# Patient Record
Sex: Male | Born: 1970
Health system: Southern US, Community
[De-identification: ages and names within clinical notes are randomized; demographics above are authoritative.]

## PROBLEM LIST (undated history)

## (undated) ENCOUNTER — Ambulatory Visit: Source: Home / Self Care

## (undated) ENCOUNTER — Ambulatory Visit: Admission: EM | Source: Home / Self Care

## (undated) DIAGNOSIS — M1990 Unspecified osteoarthritis, unspecified site: Secondary | ICD-10-CM

## (undated) DIAGNOSIS — M109 Gout, unspecified: Secondary | ICD-10-CM

## (undated) DIAGNOSIS — E119 Type 2 diabetes mellitus without complications: Secondary | ICD-10-CM

## (undated) HISTORY — DX: Type 2 diabetes mellitus without complications: E11.9

## (undated) HISTORY — DX: Unspecified osteoarthritis, unspecified site: M19.90

---

## 2000-01-19 ENCOUNTER — Emergency Department (HOSPITAL_COMMUNITY): Admission: EM | Admit: 2000-01-19 | Discharge: 2000-01-19 | Payer: Self-pay | Admitting: Emergency Medicine

## 2004-11-19 ENCOUNTER — Ambulatory Visit (HOSPITAL_COMMUNITY): Admission: RE | Admit: 2004-11-19 | Discharge: 2004-11-19 | Payer: Self-pay | Admitting: Orthopedic Surgery

## 2007-10-10 ENCOUNTER — Emergency Department (HOSPITAL_COMMUNITY): Admission: EM | Admit: 2007-10-10 | Discharge: 2007-10-11 | Payer: Self-pay | Admitting: Emergency Medicine

## 2010-12-28 ENCOUNTER — Encounter: Payer: Self-pay | Admitting: Orthopedic Surgery

## 2011-09-16 LAB — RAPID URINE DRUG SCREEN, HOSP PERFORMED
Amphetamines: NOT DETECTED
Barbiturates: NOT DETECTED
Benzodiazepines: NOT DETECTED
Cocaine: POSITIVE — AB
Opiates: NOT DETECTED
Tetrahydrocannabinol: NOT DETECTED

## 2011-09-16 LAB — POCT I-STAT CREATININE
Creatinine, Ser: 1.5
Operator id: 272551

## 2011-09-16 LAB — CBC
HCT: 50.2
Hemoglobin: 17.1 — ABNORMAL HIGH
MCHC: 34
MCV: 90.7
Platelets: 323
RBC: 5.53
RDW: 12.9
WBC: 10.8 — ABNORMAL HIGH

## 2011-09-16 LAB — I-STAT 8, (EC8 V) (CONVERTED LAB)
BUN: 15
Bicarbonate: 24.2 — ABNORMAL HIGH
Chloride: 109
Glucose, Bld: 109 — ABNORMAL HIGH
HCT: 52
Hemoglobin: 17.7 — ABNORMAL HIGH
Operator id: 272551
Potassium: 3.9
Sodium: 138
TCO2: 25
pCO2, Ven: 39 — ABNORMAL LOW
pH, Ven: 7.401 — ABNORMAL HIGH

## 2011-09-16 LAB — ETHANOL: Alcohol, Ethyl (B): 5

## 2013-03-21 ENCOUNTER — Ambulatory Visit (INDEPENDENT_AMBULATORY_CARE_PROVIDER_SITE_OTHER): Payer: BC Managed Care – PPO | Admitting: Family Medicine

## 2013-03-21 VITALS — BP 114/82 | HR 80 | Temp 98.8°F | Resp 18 | Wt 219.0 lb

## 2013-03-21 DIAGNOSIS — L03317 Cellulitis of buttock: Secondary | ICD-10-CM

## 2013-03-21 DIAGNOSIS — D2320 Other benign neoplasm of skin of unspecified ear and external auricular canal: Secondary | ICD-10-CM

## 2013-03-21 DIAGNOSIS — L0231 Cutaneous abscess of buttock: Secondary | ICD-10-CM

## 2013-03-21 DIAGNOSIS — D2322 Other benign neoplasm of skin of left ear and external auricular canal: Secondary | ICD-10-CM

## 2013-03-21 DIAGNOSIS — M25569 Pain in unspecified knee: Secondary | ICD-10-CM

## 2013-03-21 DIAGNOSIS — M25562 Pain in left knee: Secondary | ICD-10-CM

## 2013-03-21 MED ORDER — MELOXICAM 7.5 MG PO TABS: ORAL_TABLET | ORAL | Status: DC

## 2013-03-21 MED ORDER — DOXYCYCLINE HYCLATE 100 MG PO TABS: 100.0000 mg | ORAL_TABLET | Freq: Two times a day (BID) | ORAL | Status: DC

## 2013-03-21 NOTE — Progress Notes (Signed)
Procedure Note: Verbal consent obtained from the patient.  Local anesthesia with 0.2cc 1% plain lidocaine.  Sterile prep.  Lesion shaved and sent for pathology.  Hemostasis obtained with direct pressure.  Dressing applied.  Pt tolerated well.

## 2013-03-21 NOTE — Progress Notes (Addendum)
[facility] [address], [place] [address] [phone]  Date:  03/21/2013   Name:  Chase Higgins   DOB:  09-25-1971   MRN:  [id number]  PCP:  No primary provider on file.    Chief Complaint: Knee Pain and infection of left ear   History of Present Illness:  Chase Higgins is a 42 y.o. 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  He stepped on the handle of a shovel hat was lying on the ground and his left leg jerked. He did not have immediate pain, but this am he noted pain in the medial/ posterior left knee joint line.  The pain in his knee has gotten better today, he did not take any medication as of yet.    No history of knee surgery, but he would like to think about this at some point.  His records are all at [facility] ortho so he plans to talk with them about this.    He also notes a small ?growth on the certiledge of his left ear.  It has gotten larger and it hurts to lie on that side.  He is not aware of any injury to the area.    He also mentioned a "staph infection" of his left buttock near the anal area that has been present for 3 or 4 days and is becoming more painful.  It is now difficult to sit on the area.    He is otherwise generally healthy.    There is no problem list on file for this patient.   Past Medical History  Diagnosis Date  . Arthritis     History reviewed. No pertinent past surgical history.  History  Substance Use Topics  . Smoking status: Current Every Day Smoker  . Smokeless tobacco: Not on file  . Alcohol Use: Yes    Family History  Problem Relation Age of Onset  . Hypertension Mother   . Diabetes Father     No Known Allergies  Medication list has been reviewed and updated.  No current  outpatient prescriptions on file prior to visit.   No current facility-administered medications on file prior to visit.    Review of Systems:  As per HPI- otherwise negative.   Physical Examination: Filed Vitals:   03/21/13 1228  BP: 114/82  Pulse: 80  Temp: 98.8 F (37.1 C)  Resp: 18   Filed Vitals:   03/21/13 1228  Weight: 219 lb (99.338 kg)   There is no height on file to calculate BMI. Ideal Body Weight:    GEN: WDWN, NAD, Non-toxic, A & O x 3 HEENT: Atraumatic, Normocephalic. Neck supple. No masses, No LAD. Ears and Nose: No external deformity. CV: RRR, No M/G/R. No JVD. No thrill. No extra heart sounds. PULM: CTA B, no wheezes, crackles, rhonchi. No retractions. No resp. distress. No accessory muscle use. ABD: S, NT, ND, +BS. No rebound. No HSM. EXTR: No c/c/e NEURO Normal gait.  PSYCH: Normally interactive. Conversant. Not depressed or anxious appearing.  Calm demeanor.  left knee:   Tender at the medial posterior knee- seems to be at the hamstrings insertion.  He has minimal laxity to anterior drawer testing.  There is no joint effusion, and  the knee joint does display any heat/ redness or tenderness.  He has normal quad and hamstring strength, normal patellar DTR bilaterally left ear: there is a cutaneous lesion at the helix, which appears consistent with a wart or cutanous horn.   Left buttock: there is a tender and fluctuant area in the right buttock, about 1 inch lateral to the anus.    Procedure note: buttock abscess.   VC obtained: prepped with betadine and alcohol, anesthesia to buttock with 2ml of 1% lidocaine with epi.  Performed I and D with 11 blade and expressed a large amount of pus.  Placed about 3inches of 1/2 packing and dressed.    See procedure note per Frances Furbish PA- C regarding procedure to left ear.    Assessment and Plan: Knee pain, acute, left - Plan: meloxicam (MOBIC) 7.5 MG tablet  Benign skin growth of ear, left - Plan: Dermatology  pathology  Cellulitis and abscess of buttock - Plan: Wound culture, doxycycline (VIBRA-TABS) 100 MG tablet, meloxicam (MOBIC) 7.5 MG tablet  Treat knee pain with meloxicam, await wound culture and dermatology path report.  Treat with doxycycline, and recheck for wound care in 1 to 2 days  He plans to follow- up with ortho for his knee issues  Signed Abbe Amsterdam, MD  03/23/13- received wound culture result- negative at 2 days, still growing.   4/22:  Called and LMOM.  Wound culture grew just mixed bacteria. As long as he is better nothing further needed there.  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  However, there were also possible changes of actinic keratosis, which is precancerous. recommended a recheck in clinic in one month.  Will also send a letter

## 2013-03-21 NOTE — Patient Instructions (Addendum)
Use the antibiotic as directed and the pain medicine as needed. Keep both of your areas clean and dry.  Please come by clinic tomorrow or early Wednesday to have the abscess rechecked.  The pain medicine should also help with your knee pain.    Please show this paper as a FAST PASS for WOUND CARE

## 2013-03-24 ENCOUNTER — Telehealth: Payer: Self-pay | Admitting: Radiology

## 2013-03-24 NOTE — Telephone Encounter (Signed)
Spoke to patient he states area of abscess is much better, advised him preliminary report (gram stain ) was negative, he states he took packing out when he got home and is doing fine, he states a lot of pus drained out of the area, now it has closed. He states he will come back in if it bothers him again, but he is too busy to come in now. He is advised we will call when wound culture final. He also advised he has not seen ortho yet for knee, he wants to put this off as long as possible. To you FYI , I think you tried to call him.

## 2013-03-25 LAB — WOUND CULTURE: Gram Stain: NONE SEEN

## 2013-03-29 ENCOUNTER — Encounter: Payer: Self-pay | Admitting: Family Medicine

## 2013-06-22 ENCOUNTER — Emergency Department (HOSPITAL_COMMUNITY)
Admission: EM | Admit: 2013-06-22 | Discharge: 2013-06-22 | Disposition: A | Discharge status: Home / Self Care | Payer: BC Managed Care – PPO | Attending: Emergency Medicine | Admitting: Emergency Medicine

## 2013-06-22 ENCOUNTER — Encounter (HOSPITAL_COMMUNITY): Payer: Self-pay | Admitting: Emergency Medicine

## 2013-06-22 ENCOUNTER — Emergency Department (HOSPITAL_COMMUNITY): Payer: BC Managed Care – PPO

## 2013-06-22 DIAGNOSIS — M129 Arthropathy, unspecified: Secondary | ICD-10-CM | POA: Insufficient documentation

## 2013-06-22 DIAGNOSIS — Y9389 Activity, other specified: Secondary | ICD-10-CM | POA: Insufficient documentation

## 2013-06-22 DIAGNOSIS — S93602A Unspecified sprain of left foot, initial encounter: Secondary | ICD-10-CM

## 2013-06-22 DIAGNOSIS — X500XXA Overexertion from strenuous movement or load, initial encounter: Secondary | ICD-10-CM | POA: Insufficient documentation

## 2013-06-22 DIAGNOSIS — Y9229 Other specified public building as the place of occurrence of the external cause: Secondary | ICD-10-CM | POA: Insufficient documentation

## 2013-06-22 DIAGNOSIS — S93609A Unspecified sprain of unspecified foot, initial encounter: Secondary | ICD-10-CM | POA: Insufficient documentation

## 2013-06-22 DIAGNOSIS — F17200 Nicotine dependence, unspecified, uncomplicated: Secondary | ICD-10-CM | POA: Insufficient documentation

## 2013-06-22 MED ORDER — HYDROCODONE-ACETAMINOPHEN 5-325 MG PO TABS: ORAL_TABLET | ORAL | Status: DC

## 2013-06-22 MED ORDER — IBUPROFEN 800 MG PO TABS: 800.0000 mg | ORAL_TABLET | Freq: Three times a day (TID) | ORAL | Status: DC

## 2013-06-22 NOTE — ED Provider Notes (Signed)
History    CSN: [id number] Arrival date & time 06/22/13  5784  First MD Initiated Contact with Patient 06/22/13 0601     Chief Complaint  Patient presents with  . Foot Injury   (Consider location/radiation/quality/duration/timing/severity/associated sxs/prior Treatment) HPI Pt is a 42yo male presenting with gradually worsening left foot pain that started this morning after pt stepped in a hole at a [employer].  Pt states he was getting out of his truck when his foot fell through several wood planks where the water drains, does not recall if he twisted his ankle or not.  States pain gradually increased by the time he got home, constant, 7/10, aching and throbbing pain, worse with weight bearing. Has tried ibuprofen w/o relief. Denies ecchymosis or open wound to the skin. Denies previous injury of left foot.  Denies hitting head, back pain, or any other injuries  Past Medical History  Diagnosis Date  . Arthritis    History reviewed. No pertinent past surgical history. Family History  Problem Relation Age of Onset  . Hypertension Mother   . Diabetes Father    History  Substance Use Topics  . Smoking status: Current Every Day Smoker  . Smokeless tobacco: Not on file  . Alcohol Use: Yes    Review of Systems  Musculoskeletal: Positive for myalgias and arthralgias.  Skin: Negative for color change and wound.  Neurological: Negative for dizziness, light-headedness and headaches.  All other systems reviewed and are negative.    Allergies  Review of patient's allergies indicates no known allergies.  Home Medications   Current Outpatient Rx  Name  Route  Sig  Dispense  Refill  . ibuprofen (ADVIL,MOTRIN) 200 MG tablet   Oral   Take 600 mg by mouth every 6 (six) hours as needed for pain.         Marland Kitchen HYDROcodone-acetaminophen (NORCO/VICODIN) 5-325 MG per tablet      Take 1-2 pills every 4-6 hours as needed for pain.   6 tablet   0   . ibuprofen (ADVIL,MOTRIN) 800  MG tablet   Oral   Take 1 tablet (800 mg total) by mouth 3 (three) times daily.   21 tablet   0    BP 147/98  Pulse 107  Temp(Src) 99 F (37.2 C) (Oral)  Resp 14  SpO2 97% Physical Exam  Nursing note and vitals reviewed. Constitutional: He appears well-developed and well-nourished.  HENT:  Head: Normocephalic and atraumatic.  Eyes: Conjunctivae are normal. No scleral icterus.  Neck: Normal range of motion.  Cardiovascular: Normal rate, regular rhythm and normal heart sounds.   Pulmonary/Chest: Effort normal and breath sounds normal. No respiratory distress. He has no wheezes. He has no rales. He exhibits no tenderness.  Musculoskeletal: Normal range of motion. He exhibits tenderness (dorsum of left foot, along lateral aspect of foot near head of 5th metatarsal). He exhibits no edema.  No edema. FROM. TTP along dorsum or left foot, and along lateral aspect of left foot near head of 5th metatarsal.  Pain with weight bearing.   Neurological: He is alert.  Skin: Skin is warm and dry.  Skin in tact, no ecchymosis or erythema    Psychiatric: He has a normal mood and affect. His behavior is normal.    ED Course  Procedures (including critical care time) Labs Reviewed - No data to display Dg Foot Complete Left  06/22/2013   *RADIOLOGY REPORT*  Clinical Data: Fall, lateral pain  LEFT FOOT - COMPLETE  3+ VIEW  Comparison: None.  Findings: Normal alignment without fracture.  Preserved joint spaces.  No soft tissue abnormality.  IMPRESSION: No acute osseous finding   Original Report Authenticated By: Judie Petit. Shick, M.D.   1. Foot sprain, left, initial encounter     MDM  Pt has sprained foot, no fracture on imaging.  No open skin or edema.  FROM.  Able to weight bear with some pain.  Will  tx symptomatically with ace wrap.  Pt already has a crutch he used to come to ED.   Rx: norco, ibuprofen, ace wrap. Will discharge pt home and have him f/u with [facility] for continued  pain in 1-2 weeks, info provided. Return precautions given. Pt verbalized understanding and agreement with tx plan. Vitals: unremarkable. Discharged in stable condition.       Junius Finner, PA-C 06/22/13 [phone]

## 2013-06-22 NOTE — ED Notes (Signed)
PT. MISSED HIS STEP AND INJURED HIS LEFT FOOT THIS MORNING WITH PAIN /SWELLING .

## 2013-06-22 NOTE — ED Provider Notes (Signed)
Medical screening examination/treatment/procedure(s) were performed by non-physician practitioner and as supervising physician I was immediately available for consultation/collaboration.   Brandt Loosen, MD 06/22/13 [phone]

## 2013-08-09 ENCOUNTER — Ambulatory Visit: Payer: BC Managed Care – PPO

## 2013-08-09 ENCOUNTER — Ambulatory Visit (INDEPENDENT_AMBULATORY_CARE_PROVIDER_SITE_OTHER): Payer: BC Managed Care – PPO | Admitting: Emergency Medicine

## 2013-08-09 VITALS — BP 122/88 | HR 84 | Temp 98.0°F | Resp 20 | Ht 66.0 in | Wt 196.0 lb

## 2013-08-09 DIAGNOSIS — M549 Dorsalgia, unspecified: Secondary | ICD-10-CM

## 2013-08-09 DIAGNOSIS — J018 Other acute sinusitis: Secondary | ICD-10-CM

## 2013-08-09 DIAGNOSIS — J209 Acute bronchitis, unspecified: Secondary | ICD-10-CM

## 2013-08-09 DIAGNOSIS — M25572 Pain in left ankle and joints of left foot: Secondary | ICD-10-CM

## 2013-08-09 DIAGNOSIS — M25579 Pain in unspecified ankle and joints of unspecified foot: Secondary | ICD-10-CM

## 2013-08-09 MED ORDER — AMOXICILLIN-POT CLAVULANATE 875-125 MG PO TABS: 1.0000 | ORAL_TABLET | Freq: Two times a day (BID) | ORAL | Status: DC

## 2013-08-09 MED ORDER — PSEUDOEPHEDRINE-GUAIFENESIN ER 60-600 MG PO TB12: 1.0000 | ORAL_TABLET | Freq: Two times a day (BID) | ORAL | Status: DC

## 2013-08-09 MED ORDER — NAPROXEN SODIUM 550 MG PO TABS: 550.0000 mg | ORAL_TABLET | Freq: Two times a day (BID) | ORAL | Status: DC

## 2013-08-09 MED ORDER — IPRATROPIUM BROMIDE 0.02 % IN SOLN
0.5000 mg | Freq: Once | RESPIRATORY_TRACT | Status: AC
  Administered 2013-08-09: 0.5 mg via RESPIRATORY_TRACT

## 2013-08-09 MED ORDER — ALBUTEROL SULFATE (2.5 MG/3ML) 0.083% IN NEBU
2.5000 mg | INHALATION_SOLUTION | Freq: Once | RESPIRATORY_TRACT | Status: AC
  Administered 2013-08-09: 2.5 mg via RESPIRATORY_TRACT

## 2013-08-09 MED ORDER — ALBUTEROL SULFATE HFA 108 (90 BASE) MCG/ACT IN AERS: 2.0000 | INHALATION_SPRAY | RESPIRATORY_TRACT | Status: DC | PRN

## 2013-08-09 MED ORDER — CYCLOBENZAPRINE HCL 10 MG PO TABS: 10.0000 mg | ORAL_TABLET | Freq: Three times a day (TID) | ORAL | Status: DC | PRN

## 2013-08-09 NOTE — Patient Instructions (Signed)
Metered Dose Inhaler (No Spacer Used) Inhaled medicines are the basis of asthma treatment and other breathing problems. Inhaled medicine can only be effective if used properly. Good technique assures that the medicine reaches the lungs. Metered dose inhalers (MDIs) are used to deliver a variety of inhaled medicines. These include quick relief medicines, controller medicines (such as corticosteroids), and cromolyn. The medicine is delivered by pushing down on a metal canister to release a set amount of spray.  If you are using different kinds of inhalers, use your quick relief medicine to open the airways 10 to 15 minutes before using a steroid. If you are unsure which inhalers to use and the order of using them, ask your caregiver, nurse, or respiratory therapist. HOW TO USE THE INHALER 1. Remove cap from inhaler. 2. Shake inhaler for 5 seconds before each inhalation (breathing in). 3. Position the inhaler so that the top of the canister faces up. 4. Put your index finger on the top of the medication canister. Your thumb supports the bottom of the inhaler. 5. Open your mouth. 6. Hold the inhaler 1 to 2 inches away from your open mouth. This allows the medicine to slow down before the medicine enters the mouth. 7. Exhale (breathe out) normally and as completely as possible. 8. Press the canister down with the index finger to release the medication. 9. At the same time as the canister is pressed, inhale deeply and slowly until the lungs are completely filled. This should take 4 to 6 seconds. Keep your tongue down. 10. Hold the medication in your lungs for up to 10 seconds (10 seconds is best). This helps the medicine get into the small airways of your lungs to work better. 11. Breathe out slowly, through pursed lips. Whistling is an example of pursed lips. 12. Wait at least 1 minute between puffs. Continue with the above steps until you have taken the number of puffs your caregiver has  ordered. 13. Replace cap on inhaler. AVOID:  Inhaling before or after starting the spray of medicine. It takes practice to coordinate your breathing with triggering the spray.  Inhaling through the nose (rather than the mouth) when triggering the spray. HOW TO DETERMINE IF YOUR INHALER IS FULL OR NEARLY EMPTY:  Determine when an inhaler is empty. You cannot know when an MDI canister is empty by shaking it. A few MDIs are now being made with dose counters. Ask your caregiver for a prescription that has a dose counter if you feel you need that extra help.  If your inhaler does not have a counter, check the number of doses in the inhaler before you use it. The canister or box will list the number of doses in the canister. Divide the total number of doses in the canister by the number you will use each day to find how many days the canister will last. (For example, if your canister has 200 doses and you take 2 puffs, 4 times each day, which is 8 puffs a day. Dividing 200 by 8 equals 25. The canister should last 25 days.) Using a calendar, count forward that many days to see when your inhaler will run out. Write the refill date on a calendar or your canister.  Remember, if you need to take extra doses, the inhaler will empty sooner than you figured. Be sure you have a refill before your canister runs out. Refill your inhaler 7 to 10 days before it runs out. HOME CARE INSTRUCTIONS   Do   not use the inhaler more than your caregiver tells you. If you are still wheezing and are feeling tightness in your chest, call your caregiver.  Keep an adequate supply of medication. This includes making sure the medicine is not expired, and you have a spare MDI.  Follow your caregiver or inhaler insert directions for cleaning the inhaler. SEEK MEDICAL CARE IF:   Symptoms are only partially relieved with your inhalers.  You are having trouble using your inhalers.  You experience some increase in phlegm.  You  develop a fever of 102 F (38.9 C). SEEK IMMEDIATE MEDICAL CARE IF:   You feel little or no relief with your inhalers. You are still wheezing and are feeling shortness of breath and/or tightness in your chest.  You have side effects such as dizziness, headaches, or fast heart rate.  You have chills, fever, night sweats or an oral temperature above 102 F (38.9 C) develops.  Phlegm production increases a lot, or there is blood in the phlegm. MAKE SURE YOU:   Understand these instructions.  Will watch your condition.  Will get help right away if you are not doing well or get worse. Document Released: 09/21/2007 Document Revised: 02/16/2012 Document Reviewed: 09/11/2009 Exit[employer] Patient Information 2014 [employer].  
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
## 2013-08-29 DIAGNOSIS — Z0271 Encounter for disability determination: Secondary | ICD-10-CM

## 2013-09-21 ENCOUNTER — Ambulatory Visit: Payer: BC Managed Care – PPO | Admitting: Podiatry

## 2013-09-28 ENCOUNTER — Ambulatory Visit: Payer: BC Managed Care – PPO

## 2013-09-28 ENCOUNTER — Encounter: Payer: Self-pay | Admitting: Podiatry

## 2013-09-28 ENCOUNTER — Ambulatory Visit (INDEPENDENT_AMBULATORY_CARE_PROVIDER_SITE_OTHER): Payer: BC Managed Care – PPO

## 2013-09-28 ENCOUNTER — Ambulatory Visit (INDEPENDENT_AMBULATORY_CARE_PROVIDER_SITE_OTHER): Payer: BC Managed Care – PPO | Admitting: Podiatry

## 2013-09-28 VITALS — BP 122/82 | HR 101 | Resp 12 | Ht 67.0 in | Wt 208.0 lb

## 2013-09-28 DIAGNOSIS — M7750 Other enthesopathy of unspecified foot: Secondary | ICD-10-CM

## 2013-09-28 DIAGNOSIS — S93609A Unspecified sprain of unspecified foot, initial encounter: Secondary | ICD-10-CM

## 2013-09-28 DIAGNOSIS — R52 Pain, unspecified: Secondary | ICD-10-CM

## 2013-09-28 MED ORDER — MELOXICAM 15 MG PO TABS: 15.0000 mg | ORAL_TABLET | Freq: Every day | ORAL | Status: DC

## 2013-09-28 MED ORDER — TRIAMCINOLONE ACETONIDE 10 MG/ML IJ SUSP: 5.0000 mg | Freq: Once | INTRAMUSCULAR | Status: DC

## 2013-09-28 NOTE — Progress Notes (Signed)
Subjective:     Patient ID: Chase Higgins, male   DOB: [birth year], 42 y.o.   MRN: [id number]  Foot Injury    patient presents stating that he twisted his foot 3 months ago and he still has trouble wearing any shoes and is currently wearing flip-flops points the left foot. Also complains of dry skin and possible fungal infection bilateral. Have gone to  internal medicine where he was checked and found to be inflamed with no indications of fracture at that time   Review of Systems  All other systems reviewed and are negative.       Objective:   Physical Exam  Nursing note and vitals reviewed. Constitutional: He appears well-developed and well-nourished.  Cardiovascular: Intact distal pulses.   Musculoskeletal: Normal range of motion.  Neurological: He is alert.  Skin: Skin is warm.   discomfort lateral aspect left foot peroneal brevis insertion and proximal to this point. No signs of muscle strength loss or tendon tear     Assessment:     Inflamed peroneal tendon left secondary to injury and probable athlete's foot bilateral    Plan:     H&P and x-rays reviewed with patient. I have recommended injection treatment which was accomplished today along with complete immobilization with air fracture walker and ice therapy. Placed on Mobic and we will treat his athlete's foot at next visit

## 2013-09-28 NOTE — Progress Notes (Signed)
N  SORE L  LT FOOT D 3 MONTHS O SLOWLY C  SAME A   PRESSURE T  NONE

## 2013-09-28 NOTE — Progress Notes (Signed)
N ITCHES L  B/L FOOT D 2 YEARS O  SLOWLY C  WORSE A  SHOES T  SOAK, ANTI FUNGAL CREAM

## 2013-10-03 ENCOUNTER — Emergency Department (HOSPITAL_COMMUNITY)
Admission: EM | Admit: 2013-10-03 | Discharge: 2013-10-03 | Disposition: A | Discharge status: Home / Self Care | Payer: BC Managed Care – PPO | Attending: Emergency Medicine | Admitting: Emergency Medicine

## 2013-10-03 ENCOUNTER — Encounter (HOSPITAL_COMMUNITY): Payer: Self-pay | Admitting: Emergency Medicine

## 2013-10-03 DIAGNOSIS — M129 Arthropathy, unspecified: Secondary | ICD-10-CM | POA: Insufficient documentation

## 2013-10-03 DIAGNOSIS — F17200 Nicotine dependence, unspecified, uncomplicated: Secondary | ICD-10-CM | POA: Insufficient documentation

## 2013-10-03 DIAGNOSIS — F1010 Alcohol abuse, uncomplicated: Secondary | ICD-10-CM | POA: Insufficient documentation

## 2013-10-03 DIAGNOSIS — F1410 Cocaine abuse, uncomplicated: Secondary | ICD-10-CM | POA: Insufficient documentation

## 2013-10-03 LAB — CBC
HCT: 52.2 % — ABNORMAL HIGH (ref 39.0–52.0)
Hemoglobin: 18.4 g/dL — ABNORMAL HIGH (ref 13.0–17.0)
MCH: 32 pg (ref 26.0–34.0)
MCHC: 35.2 g/dL (ref 30.0–36.0)
MCV: 90.8 fL (ref 78.0–100.0)
Platelets: 285 10*3/uL (ref 150–400)
RBC: 5.75 MIL/uL (ref 4.22–5.81)
RDW: 13.2 % (ref 11.5–15.5)
WBC: 13.7 10*3/uL — ABNORMAL HIGH (ref 4.0–10.5)

## 2013-10-03 LAB — COMPREHENSIVE METABOLIC PANEL
ALT: 19 U/L (ref 0–53)
AST: 12 U/L (ref 0–37)
Albumin: 3.9 g/dL (ref 3.5–5.2)
Alkaline Phosphatase: 72 U/L (ref 39–117)
BUN: 10 mg/dL (ref 6–23)
CO2: 21 mEq/L (ref 19–32)
Calcium: 9.4 mg/dL (ref 8.4–10.5)
Chloride: 98 mEq/L (ref 96–112)
Creatinine, Ser: 1.22 mg/dL (ref 0.50–1.35)
GFR calc Af Amer: 82 mL/min — ABNORMAL LOW (ref >90)
GFR calc non Af Amer: 71 mL/min — ABNORMAL LOW (ref >90)
Glucose, Bld: 117 mg/dL — ABNORMAL HIGH (ref 70–99)
Potassium: 3.9 mEq/L (ref 3.5–5.1)
Sodium: 134 mEq/L — ABNORMAL LOW (ref 135–145)
Total Bilirubin: 0.9 mg/dL (ref 0.3–1.2)
Total Protein: 7.3 g/dL (ref 6.0–8.3)

## 2013-10-03 LAB — ETHANOL: Alcohol, Ethyl (B): 49 mg/dL — ABNORMAL HIGH (ref 0–11)

## 2013-10-03 LAB — ACETAMINOPHEN LEVEL: Acetaminophen (Tylenol), Serum: <15 ug/mL (ref 10–30)

## 2013-10-03 LAB — SALICYLATE LEVEL: Salicylate Lvl: <2 mg/dL — ABNORMAL LOW (ref 2.8–20.0)

## 2013-10-03 NOTE — ED Notes (Addendum)
Pt presents to ed stating needs detox from alcohol and drugs (crack, weed). Last drink was "rigth before I came in here", last cocaine use at about same time. Pt denies SI/HI.

## 2013-10-03 NOTE — ED Provider Notes (Addendum)
CSN: [id number]     Arrival date & time 10/03/13  1245 History   First MD Initiated Contact with Patient 10/03/13 1503    Chief complaint: substance abuse   (Consider location/radiation/quality/duration/timing/severity/associated sxs/prior Treatment) The history is provided by the patient. The history is limited by the condition of the patient.  pt with hx cocaine, thc, and etoh abuse. Episodic. Pt last used just pta. States needs rehab. Pt v poor/uncooperative historian - level 5 caveat.     Past Medical History  Diagnosis Date  . Arthritis    History reviewed. No pertinent past surgical history. Family History  Problem Relation Age of Onset  . Hypertension Mother   . Diabetes Father    History  Substance Use Topics  . Smoking status: Current Every Day Smoker  . Smokeless tobacco: Not on file  . Alcohol Use: Yes    Review of Systems  Unable to perform ROS ?intoxicated.  Level 5 caveat.     Allergies  Review of patient's allergies indicates no known allergies.  Home Medications   Current Outpatient Rx  Name  Route  Sig  Dispense  Refill  . ibuprofen (ADVIL,MOTRIN) 200 MG tablet   Oral   Take 600 mg by mouth every 6 (six) hours as needed for pain.          BP 107/77  Pulse 96  Temp(Src) 98.1 F (36.7 C) (Oral)  Resp 20  SpO2 96% Physical Exam  Nursing note and vitals reviewed. Constitutional: He appears well-developed and well-nourished. No distress.  HENT:  Head: Atraumatic.  Mouth/Throat: Oropharynx is clear and moist.  Eyes: No scleral icterus.  Neck: Neck supple. No tracheal deviation present.  Cardiovascular: Normal rate, regular rhythm, normal heart sounds and intact distal pulses.   Pulmonary/Chest: Effort normal and breath sounds normal. No accessory muscle usage. No respiratory distress.  Abdominal: Soft. Bowel sounds are normal. He exhibits no distension. There is no tenderness.  Musculoskeletal: Normal range of motion. He exhibits no edema  and no tenderness.  Neurological:  Sleeping, arousable. Uncooperative w neuro exam.   Skin: Skin is warm and dry.  Psychiatric:  ?intoxicated. Arouses to voice, command, falls back asleep.     ED Course  Procedures (including critical care time) Labs Review  Results for orders placed during the hospital encounter of 10/03/13  ACETAMINOPHEN LEVEL      Result Value Range   Acetaminophen (Tylenol), Serum <15.0  10 - 30 ug/mL  CBC      Result Value Range   WBC 13.7 (*) 4.0 - 10.5 K/uL   RBC 5.75  4.22 - 5.81 MIL/uL   Hemoglobin 18.4 (*) 13.0 - 17.0 g/dL   HCT 40.9 (*) 81.1 - 91.4 %   MCV 90.8  78.0 - 100.0 fL   MCH 32.0  26.0 - 34.0 pg   MCHC 35.2  30.0 - 36.0 g/dL   RDW 78.2  95.6 - 21.3 %   Platelets 285  150 - 400 K/uL  COMPREHENSIVE METABOLIC PANEL      Result Value Range   Sodium 134 (*) 135 - 145 mEq/L   Potassium 3.9  3.5 - 5.1 mEq/L   Chloride 98  96 - 112 mEq/L   CO2 21  19 - 32 mEq/L   Glucose, Bld 117 (*) 70 - 99 mg/dL   BUN 10  6 - 23 mg/dL   Creatinine, Ser 0.86  0.50 - 1.35 mg/dL   Calcium 9.4  8.4 - 57.8 mg/dL  Total Protein 7.3  6.0 - 8.3 g/dL   Albumin 3.9  3.5 - 5.2 g/dL   AST 12  0 - 37 U/L   ALT 19  0 - 53 U/L   Alkaline Phosphatase 72  39 - 117 U/L   Total Bilirubin 0.9  0.3 - 1.2 mg/dL   GFR calc non Af Amer 71 (*) >90 mL/min   GFR calc Af Amer 82 (*) >90 mL/min  ETHANOL      Result Value Range   Alcohol, Ethyl (B) 49 (*) 0 - 11 mg/dL  SALICYLATE LEVEL      Result Value Range   Salicylate Lvl <2.0 (*) 2.8 - 20.0 mg/dL      EKG Interpretation   None       MDM  Labs.  Reviewed nursing notes and prior charts for additional history.   Po fluids/meal.  Discussed w psych team - will provide referrals for outpt rehab/detox options and community resource guide.   Pt up, alert, has eaten meal.  Ambulates w steady gait.       Suzi Roots, MD 10/03/13 [phone]

## 2013-10-03 NOTE — ED Notes (Signed)
Pt. Is unable to give a urine specimen at this time, but is aware that we need need a specimen. Urine cup at bedside.

## 2013-10-11 ENCOUNTER — Ambulatory Visit
Admission: RE | Admit: 2013-10-11 | Discharge: 2013-10-11 | Disposition: A | Discharge status: Home / Self Care | Payer: BC Managed Care – PPO | Source: Ambulatory Visit | Attending: General Practice | Admitting: General Practice

## 2013-10-11 ENCOUNTER — Other Ambulatory Visit: Payer: Self-pay | Admitting: General Practice

## 2013-10-11 DIAGNOSIS — R7611 Nonspecific reaction to tuberculin skin test without active tuberculosis: Secondary | ICD-10-CM

## 2013-10-19 ENCOUNTER — Ambulatory Visit: Payer: BC Managed Care – PPO | Admitting: Podiatry

## 2013-10-27 ENCOUNTER — Ambulatory Visit: Payer: BC Managed Care – PPO | Admitting: Podiatry

## 2013-11-07 ENCOUNTER — Encounter: Payer: Self-pay | Admitting: Podiatry

## 2013-11-07 ENCOUNTER — Ambulatory Visit (INDEPENDENT_AMBULATORY_CARE_PROVIDER_SITE_OTHER): Payer: BC Managed Care – PPO | Admitting: Podiatry

## 2013-11-07 VITALS — BP 102/65 | HR 86 | Resp 12

## 2013-11-07 DIAGNOSIS — L259 Unspecified contact dermatitis, unspecified cause: Secondary | ICD-10-CM

## 2013-11-07 DIAGNOSIS — S93409A Sprain of unspecified ligament of unspecified ankle, initial encounter: Secondary | ICD-10-CM

## 2013-11-08 NOTE — Progress Notes (Signed)
Subjective:     Patient ID: Chase Higgins, male   DOB: Oct 05, 1971, 42 y.o.   MRN: [id number]  HPI patient states my left ankle is still sore but it is improved over where it was and it just feels a little unstable and I would like something when I am exercising. Also concerned about discoloration in the nailbeds  Review of Systems     Objective:   Physical Exam Neurovascular status was found to be intact and I noted   some instability still in the left ankle with mild pain lateral side. Also the thickness of nailbeds Assessment:     Patient does have a unstable left ankle of a moderate nature and mycotic nail infection    Plan:     Tri-Lock ankle brace dispensed a patient with instructions and formula 3 to reduce fungal infiltration with consideration for laser procedure in future

## 2014-01-31 ENCOUNTER — Ambulatory Visit (INDEPENDENT_AMBULATORY_CARE_PROVIDER_SITE_OTHER): Payer: BC Managed Care – PPO | Admitting: Internal Medicine

## 2014-01-31 ENCOUNTER — Ambulatory Visit: Payer: BC Managed Care – PPO

## 2014-01-31 VITALS — BP 120/70 | HR 72 | Temp 97.8°F | Resp 16 | Ht 67.0 in | Wt 209.0 lb

## 2014-01-31 DIAGNOSIS — I10 Essential (primary) hypertension: Secondary | ICD-10-CM

## 2014-01-31 DIAGNOSIS — M25469 Effusion, unspecified knee: Secondary | ICD-10-CM

## 2014-01-31 DIAGNOSIS — M25461 Effusion, right knee: Secondary | ICD-10-CM

## 2014-01-31 DIAGNOSIS — M1180 Other specified crystal arthropathies, unspecified site: Secondary | ICD-10-CM

## 2014-01-31 DIAGNOSIS — E119 Type 2 diabetes mellitus without complications: Secondary | ICD-10-CM | POA: Insufficient documentation

## 2014-01-31 DIAGNOSIS — M25569 Pain in unspecified knee: Secondary | ICD-10-CM

## 2014-01-31 DIAGNOSIS — E781 Pure hyperglyceridemia: Secondary | ICD-10-CM

## 2014-01-31 MED ORDER — MELOXICAM 15 MG PO TABS: 15.0000 mg | ORAL_TABLET | Freq: Every day | ORAL | Status: DC

## 2014-01-31 MED ORDER — HYDROCODONE-ACETAMINOPHEN 10-325 MG PO TABS: 1.0000 | ORAL_TABLET | Freq: Four times a day (QID) | ORAL | Status: DC | PRN

## 2014-01-31 NOTE — Progress Notes (Addendum)
Subjective:  This chart was scribed for [name], MD by Roxan Diesel, ED scribe.  This patient was seen in [facility] and the patient's care was started at 10:58 AM.   Patient ID: [name], male    DOB: 1971/03/28, 43 y.o.   MRN: [id number]  Chief Complaint  Patient presents with  . Knee Injury    right knee    HPI   HPI Comments: Myung-Suk Demetriou is a 43 y.o. male who presents to [facility] complaining of a right knee injury that occurred 6 days ago.  Pt reports that he was working on a roof and slipped and fell onto a tile with impact to his right knee.  Initially he had only mild pain for several days, but yesterday his pain worsened after he was doing work on a ladder on his tiptoes.  Since then he has had severe pain and swelling to the anterior knee.  Pain radiates down his calf.  It is worsened by bearing weight, bending, and "everything hurts." No fever. No prior knee problems.   There are no active problems to display for this patient.  but medications indicate that he has diabetes hypertension and hypertriglyceridemia.    History reviewed. No pertinent past surgical history.  Prior to Admission medications   Medication Sig Start Date End Date Taking? Authorizing Provider  AMOXICILLIN PO Take by mouth.   Yes Historical Provider, MD  metFORMIN (GLUCOPHAGE) 1000 MG tablet Take 1,000 mg by mouth 2 (two) times daily with a meal.   Yes Historical Provider, MD  fenofibrate (TRICOR) 145 MG tablet Take 145 mg by mouth daily.    Historical Provider, MD  lisinopril (PRINIVIL,ZESTRIL) 40 MG tablet Take 40 mg by mouth daily.    Historical Provider, MD  metoprolol succinate (TOPROL-XL) 100 MG 24 hr tablet Take 100 mg by mouth daily. Take with or immediately following a meal.    Historical Provider, MD      Review of Systems  Musculoskeletal: Positive for arthralgias (right knee) and joint swelling (right knee).   no symptoms in other systems Could not sleep due to  pain last night     Objective:   Physical Exam  Nursing note and vitals reviewed. Constitutional: He is oriented to person, place, and time. He appears well-developed and well-nourished. No distress.  HENT:  Head: Normocephalic and atraumatic.  Eyes: EOM are normal.  Neck: Neck supple. No tracheal deviation present.  Cardiovascular: Normal rate.   Pulmonary/Chest: Effort normal. No respiratory distress.  Musculoskeletal:  Right knee: suprapatellar effusion.  Pain with any ROM.  Maximally tender over the medial tibia jointline.  No laxity to stressor.  Ecchymoses in dependent position down to the ankle. Right ankle full ROM.  Neurological: He is alert and oriented to person, place, and time.  Skin: Skin is warm and dry.  Psychiatric: He has a normal mood and affect. His behavior is normal.   [facility] reading (PRIMARY) by  Dr. Laney Pastor no acute bony injury of the knee seen    BP 120/70  Pulse 72  Temp(Src) 97.8 F (36.6 C) (Oral)  Resp 16  Ht 5\' 7"  (1.702 m)  Wt 209 lb (94.802 kg)  BMI 32.73 kg/m2  SpO2 96%  Procedure:  After obtaining informed consent the knee was prepped to provide a sterile field. Local lidocaine was used to decrease the pain. 50 cc of yellow fluid was withdrawn without any trauma and the knee had a better range of motion following  the procedure. Sterile dressing with Ace wrap compression was applied      Assessment & Plan:  Knee pain - Plan: DG Knee Complete 4 Views Right  Effusion of knee joint right - Plan: Synovial fluid, cell count, Cell count + diff,  w/ cryst-synvl fld, Wound culture  2 use crutches and may advance weightbearing only if no pain Will establish orthopedic followup to rule out meniscus injury Meds ordered this encounter  Medications  . AMOXICILLIN PO    Sig: Take by mouth.  Marland Kitchen HYDROcodone-acetaminophen (NORCO) 10-325 MG per tablet    Sig: Take 1 tablet by mouth every 6 (six) hours as needed.    Dispense:  30 tablet    Refill:  0   . meloxicam (MOBIC) 15 MG tablet    Sig: Take 1 tablet (15 mg total) by mouth daily.    Dispense:  30 tablet    Refill:  0     I have completed the patient encounter in its entirety as documented by the scribe, with editing by me where necessary. Rayme Bui P. Laney Pastor, M.D.  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

## 2014-02-01 ENCOUNTER — Telehealth: Payer: Self-pay | Admitting: Internal Medicine

## 2014-02-01 DIAGNOSIS — M1180 Other specified crystal arthropathies, unspecified site: Secondary | ICD-10-CM | POA: Insufficient documentation

## 2014-02-01 LAB — SYNOVIAL CELL COUNT + DIFF, W/ CRYSTALS
Eosinophils-Synovial: 0 % (ref 0–1)
Lymphocytes-Synovial Fld: 4 % (ref 0–20)
Monocyte/Macrophage: 7 % — ABNORMAL LOW (ref 50–90)
Neutrophil, Synovial: 89 % — ABNORMAL HIGH (ref 0–25)
WBC, Synovial: 34280 uL — ABNORMAL HIGH (ref 0–200)

## 2014-02-01 MED ORDER — COLCHICINE 0.6 MG PO TABS: 0.6000 mg | ORAL_TABLET | Freq: Two times a day (BID) | ORAL | Status: DC

## 2014-02-01 NOTE — Telephone Encounter (Signed)
Informed Treated colch

## 2014-04-10 ENCOUNTER — Ambulatory Visit (INDEPENDENT_AMBULATORY_CARE_PROVIDER_SITE_OTHER): Payer: BC Managed Care – PPO | Admitting: Emergency Medicine

## 2014-04-10 VITALS — BP 132/86 | HR 98 | Temp 98.3°F | Resp 16 | Ht 67.0 in | Wt 208.8 lb

## 2014-04-10 DIAGNOSIS — E119 Type 2 diabetes mellitus without complications: Secondary | ICD-10-CM

## 2014-04-10 DIAGNOSIS — M25569 Pain in unspecified knee: Secondary | ICD-10-CM

## 2014-04-10 DIAGNOSIS — M1310 Monoarthritis, not elsewhere classified, unspecified site: Secondary | ICD-10-CM

## 2014-04-10 DIAGNOSIS — M25579 Pain in unspecified ankle and joints of unspecified foot: Secondary | ICD-10-CM

## 2014-04-10 DIAGNOSIS — M109 Gout, unspecified: Secondary | ICD-10-CM

## 2014-04-10 LAB — POCT CBC
Granulocyte percent: 80.9 %G — AB (ref 37–80)
HCT, POC: 50.8 % (ref 43.5–53.7)
Hemoglobin: 17.1 g/dL (ref 14.1–18.1)
Lymph, poc: 2 (ref 0.6–3.4)
MCH, POC: 31.7 pg — AB (ref 27–31.2)
MCHC: 33.7 g/dL (ref 31.8–35.4)
MCV: 94.1 fL (ref 80–97)
MID (cbc): 0.8 (ref 0–0.9)
MPV: 8.9 fL (ref 0–99.8)
POC Granulocyte: 11.6 — AB (ref 2–6.9)
POC LYMPH PERCENT: 13.7 %L (ref 10–50)
POC MID %: 5.4 %M (ref 0–12)
Platelet Count, POC: 355 10*3/uL (ref 142–424)
RBC: 5.4 M/uL (ref 4.69–6.13)
RDW, POC: 13.5 %
WBC: 14.4 10*3/uL — AB (ref 4.6–10.2)

## 2014-04-10 LAB — BASIC METABOLIC PANEL
BUN: 10 mg/dL (ref 6–23)
CO2: 27 mEq/L (ref 19–32)
Calcium: 9.5 mg/dL (ref 8.4–10.5)
Chloride: 100 mEq/L (ref 96–112)
Creat: 1.26 mg/dL (ref 0.50–1.35)
Glucose, Bld: 116 mg/dL — ABNORMAL HIGH (ref 70–99)
Potassium: 4.2 mEq/L (ref 3.5–5.3)
Sodium: 138 mEq/L (ref 135–145)

## 2014-04-10 LAB — SYNOVIAL FLUID PANEL
Eosinophils-Synovial: 3 % — ABNORMAL HIGH (ref 0–1)
Glucose, Synovial Fluid: 106 mg/dL
Lymphocytes-Synovial Fld: 9 % (ref 0–20)
Monocyte/Macrophage: 5 % — ABNORMAL LOW (ref 50–90)
Neutrophil, Synovial: 83 % — ABNORMAL HIGH (ref 0–25)
Protein, Synovial Fluid: 5 g/dL — ABNORMAL HIGH (ref 1.0–3.0)
WBC, Synovial: 14040 cu mm — ABNORMAL HIGH (ref 0–200)

## 2014-04-10 LAB — URIC ACID: Uric Acid, Serum: 7 mg/dL (ref 4.0–7.8)

## 2014-04-10 LAB — GLUCOSE, POCT (MANUAL RESULT ENTRY): POC Glucose: 120 mg/dl — AB (ref 70–99)

## 2014-04-10 LAB — POCT GLYCOSYLATED HEMOGLOBIN (HGB A1C): Hemoglobin A1C: 6

## 2014-04-10 MED ORDER — COLCHICINE 0.6 MG PO TABS: 0.6000 mg | ORAL_TABLET | Freq: Two times a day (BID) | ORAL | Status: DC

## 2014-04-10 MED ORDER — HYDROCODONE-ACETAMINOPHEN 5-325 MG PO TABS: 1.0000 | ORAL_TABLET | Freq: Four times a day (QID) | ORAL | Status: DC | PRN

## 2014-04-10 NOTE — Progress Notes (Signed)
   Subjective:    Patient ID: Chase Higgins, male    DOB: Sep 13, 1971, 43 y.o.   MRN: [id number]  HPI 43 y.o. Male presents to clinic for left knee pain. Has hx of gout and has been treated in the past. Current pain started on Friday. Is also having some pain in the back of his right heel . Reports having to do a lot of bending and stooping at work. Is having some trouble with ambulating. Has been treated with colchicone 0.6 mg in the past and reports that it helped.   Review of Systems     Objective:   Physical Exam patient is somewhat somnolent at otherwise cooperative. Examination of the right knee reveals an effusion but minimal pain examination of the left knee reveals an effusion with pain on flexion extension. He has tenderness over the posterior right Achilles and also over the dorsum of both feet.  Results for orders placed in visit on 04/10/14  POCT CBC      Result Value Ref Range   WBC 14.4 (*) 4.6 - 10.2 K/uL   Lymph, poc 2.0  0.6 - 3.4   POC LYMPH PERCENT 13.7  10 - 50 %L   MID (cbc) 0.8  0 - 0.9   POC MID % 5.4  0 - 12 %M   POC Granulocyte 11.6 (*) 2 - 6.9   Granulocyte percent 80.9 (*) 37 - 80 %G   RBC 5.40  4.69 - 6.13 M/uL   Hemoglobin 17.1  14.1 - 18.1 g/dL   HCT, POC 50.8  43.5 - 53.7 %   MCV 94.1  80 - 97 fL   MCH, POC 31.7 (*) 27 - 31.2 pg   MCHC 33.7  31.8 - 35.4 g/dL   RDW, POC 13.5     Platelet Count, POC 355  142 - 424 K/uL   MPV 8.9  0 - 99.8 fL  POCT GLYCOSYLATED HEMOGLOBIN (HGB A1C)      Result Value Ref Range   Hemoglobin A1C 6.0    GLUCOSE, POCT (MANUAL RESULT ENTRY)      Result Value Ref Range   POC Glucose 120 (*) 70 - 99 mg/dl   procedure note. The left knee was prepped with Betadine the area was injected with 1-1/2 cc of 2% plain. Aspiration was performed with removal of 20 cc of cloudy fluid which was sent for synovial fluid analysis.      Assessment & Plan:  Patient has a history of diabetes and hypertension. We have not been following  his other medical problems. I went ahead and did his labs for diabetes. Will treat with colchicine pain medication. I advised him to sustain from alcohol while taking these medications . His white count is elevated. Fluid was sent for both culture and for crystals and CBC. Advise recheck in 48 hours. He is placed on colchicine and pain medications

## 2014-04-10 NOTE — Patient Instructions (Signed)
Take medications as prescribed. Recheck in 48 hours.

## 2014-04-14 LAB — BODY FLUID CULTURE
Gram Stain: NONE SEEN
Organism ID, Bacteria: NO GROWTH

## 2014-10-06 ENCOUNTER — Ambulatory Visit (INDEPENDENT_AMBULATORY_CARE_PROVIDER_SITE_OTHER): Payer: BC Managed Care – PPO | Admitting: Family Medicine

## 2014-10-06 VITALS — BP 124/88 | HR 107 | Temp 97.9°F | Resp 16 | Ht 68.0 in | Wt 211.0 lb

## 2014-10-06 DIAGNOSIS — E7800 Pure hypercholesterolemia, unspecified: Secondary | ICD-10-CM

## 2014-10-06 DIAGNOSIS — M25562 Pain in left knee: Secondary | ICD-10-CM

## 2014-10-06 DIAGNOSIS — E1169 Type 2 diabetes mellitus with other specified complication: Secondary | ICD-10-CM

## 2014-10-06 DIAGNOSIS — E119 Type 2 diabetes mellitus without complications: Secondary | ICD-10-CM

## 2014-10-06 DIAGNOSIS — E669 Obesity, unspecified: Secondary | ICD-10-CM

## 2014-10-06 LAB — POCT GLYCOSYLATED HEMOGLOBIN (HGB A1C): Hemoglobin A1C: 5.7

## 2014-10-06 LAB — GLUCOSE, POCT (MANUAL RESULT ENTRY): POC Glucose: 90 mg/dl (ref 70–99)

## 2014-10-06 MED ORDER — HYDROCODONE-ACETAMINOPHEN 10-325 MG PO TABS: 1.0000 | ORAL_TABLET | Freq: Four times a day (QID) | ORAL | Status: DC | PRN

## 2014-10-06 MED ORDER — COLCHICINE 0.6 MG PO TABS: 0.6000 mg | ORAL_TABLET | Freq: Two times a day (BID) | ORAL | Status: DC

## 2014-10-06 NOTE — Progress Notes (Signed)
Subjective:    Patient ID: Chase Higgins, male    DOB: 07-28-1971, 43 y.o.   MRN: [id number]  HPI Patient presents today with knee pain x 5 days. He has a history of DM, HTN, hyperlipidemia, gout of knee. He has stopped taking all his medications, "because I got out of the routine." He still has full bottles of medication, other than his colchicine and pain meds. He was last seen 5/15 for knee pain, effusion. He did not follow up as recommended. He is requesting lab work, colchicine and pain medication today. 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 He also states that colchicine clears his knee pain quickly. The patient had swelling of his knee 5 days ago that resolved spontaneously. He continues to have pain. He has not taken any medication.   He has noticed some increased hunger, thirst and fatigue lately. He ate rice, corn and potatoes for breakfast this morning. He has gained 4 pounds since 5/15.   He has had 2 days of chest congestion, non productive cough, no fever, no SOB, no wheeze. He used his albuterol inhaler with good relief. He has not had a fever. He smokes 1/2 ppd. He has tried to quit in the past using nicotine gum and vaporized nicotine. He did not tolerate the vape as it made him cough.   Review of Systems No visual changes, no headache, no chest pain, no SOB, occasional trace edema, no falls, + polyuria, + polydipsia, + polyphagia.    Objective:   Physical Exam  Vitals reviewed. Constitutional: He is oriented to person, place, and time. He appears well-developed and well-nourished.  HENT:  Head: Normocephalic and atraumatic.  Right Ear: External ear normal.  Left Ear: External ear normal.  Mouth/Throat: Oropharynx is clear and moist.  Eyes: Conjunctivae are normal. Pupils are equal, round, and reactive to light.  Neck: Normal range of motion. Neck supple.  Cardiovascular:  Normal rate, regular rhythm and normal heart sounds.   Pulmonary/Chest: Effort normal and breath sounds normal.  Musculoskeletal: Normal range of motion.       Left knee: He exhibits normal range of motion, no swelling, no effusion, normal meniscus and no MCL laxity. Tenderness found. Medial joint line and lateral joint line tenderness noted.  Neurological: He is alert and oriented to person, place, and time.  Skin: Skin is warm and dry.  Psychiatric: He has a normal mood and affect. His behavior is normal. Judgment and thought content normal.       Assessment & Plan:  1. Diabetes mellitus type 2 in obese - POCT glucose (manual entry) - POCT glycosylated hemoglobin (Hb A1C) - Comprehensive metabolic panel - Lipid panel  2. Left knee pain - HYDROcodone-acetaminophen (NORCO) 10-325 MG per tablet; Take 1 tablet by mouth every 6 (six) hours as needed for severe pain.  Dispense: 15 tablet; Refill: 0 - colchicine 0.6 MG tablet; Take 1 tablet (0.6 mg total) by mouth 2 (two) times daily.  Dispense: 20 tablet; Refill: 0  3. Hypercholesterolemia - Lipid panel  Patient had to leave prior to POCT being available. Will call him with results and advise him as to restarting his medications. Discussed importance of glucose/BP/lipid control and need for regular follow up to maintain health. Patient made an appointment for a CPE 1/16 prior to leaving the office.  Glucose 90 and HgA1C 5.7- called patient and notified him of  normal results, no need to restart metformin at this time. Work on healthy eating and weight loss.   Elby Beck, FNP-BC  [facility]  10/06/2014 11:55 AM

## 2014-10-07 LAB — COMPREHENSIVE METABOLIC PANEL WITH GFR
ALT: 23 U/L (ref 0–53)
AST: 16 U/L (ref 0–37)
Albumin: 4.8 g/dL (ref 3.5–5.2)
Alkaline Phosphatase: 66 U/L (ref 39–117)
BUN: 14 mg/dL (ref 6–23)
CO2: 22 meq/L (ref 19–32)
Calcium: 9.7 mg/dL (ref 8.4–10.5)
Chloride: 104 meq/L (ref 96–112)
Creat: 1.32 mg/dL (ref 0.50–1.35)
Glucose, Bld: 101 mg/dL — ABNORMAL HIGH (ref 70–99)
Potassium: 4.1 meq/L (ref 3.5–5.3)
Sodium: 139 meq/L (ref 135–145)
Total Bilirubin: 0.5 mg/dL (ref 0.2–1.2)
Total Protein: 7.3 g/dL (ref 6.0–8.3)

## 2014-10-07 LAB — LIPID PANEL
Cholesterol: 276 mg/dL — ABNORMAL HIGH (ref 0–200)
HDL: 41 mg/dL (ref >39)
Total CHOL/HDL Ratio: 6.7 Ratio
Triglycerides: 1312 mg/dL — ABNORMAL HIGH (ref <150)

## 2014-11-25 ENCOUNTER — Ambulatory Visit (INDEPENDENT_AMBULATORY_CARE_PROVIDER_SITE_OTHER): Payer: BC Managed Care – PPO | Admitting: Family Medicine

## 2014-11-25 ENCOUNTER — Ambulatory Visit (INDEPENDENT_AMBULATORY_CARE_PROVIDER_SITE_OTHER): Payer: BC Managed Care – PPO

## 2014-11-25 VITALS — BP 120/90 | HR 104 | Temp 98.8°F | Resp 20 | Ht 68.0 in | Wt 218.0 lb

## 2014-11-25 DIAGNOSIS — M25469 Effusion, unspecified knee: Secondary | ICD-10-CM

## 2014-11-25 DIAGNOSIS — M25562 Pain in left knee: Secondary | ICD-10-CM

## 2014-11-25 DIAGNOSIS — Z8739 Personal history of other diseases of the musculoskeletal system and connective tissue: Secondary | ICD-10-CM

## 2014-11-25 DIAGNOSIS — Z8639 Personal history of other endocrine, nutritional and metabolic disease: Secondary | ICD-10-CM

## 2014-11-25 DIAGNOSIS — M2548 Effusion, other site: Secondary | ICD-10-CM

## 2014-11-25 DIAGNOSIS — Z87828 Personal history of other (healed) physical injury and trauma: Secondary | ICD-10-CM

## 2014-11-25 LAB — URIC ACID: Uric Acid, Serum: 9.5 mg/dL — ABNORMAL HIGH (ref 4.0–7.8)

## 2014-11-25 MED ORDER — HYDROCODONE-ACETAMINOPHEN 5-325 MG PO TABS: 1.0000 | ORAL_TABLET | Freq: Four times a day (QID) | ORAL | Status: DC | PRN

## 2014-11-25 MED ORDER — NAPROXEN 500 MG PO TABS: 500.0000 mg | ORAL_TABLET | Freq: Two times a day (BID) | ORAL | Status: DC

## 2014-11-25 NOTE — Patient Instructions (Signed)
Start naproxen sodium, hydrocodone if needed, wear hinged knee brace. You should receive a call or letter about your lab results within the next week to 10 days.  If your pain is not improving in next 2 weeks - return to discuss next step as meniscus tear also possible. Return to the clinic or go to the nearest emergency room if any of your symptoms worsen or new symptoms occur.  Knee Pain The knee is the complex joint between your thigh and your lower leg. It is made up of bones, tendons, ligaments, and cartilage. The bones that make up the knee are:  The femur in the thigh.  The tibia and fibula in the lower leg.  The patella or kneecap riding in the groove on the lower femur. CAUSES  Knee pain is a common complaint with many causes. A few of these causes are:  Injury, such as:  A ruptured ligament or tendon injury.  Torn cartilage.  Medical conditions, such as:  Gout  Arthritis  Infections  Overuse, over training, or overdoing a physical activity. Knee pain can be minor or severe. Knee pain can accompany debilitating injury. Minor knee problems often respond well to self-care measures or get well on their own. More serious injuries may need medical intervention or even surgery. SYMPTOMS The knee is complex. Symptoms of knee problems can vary widely. Some of the problems are:  Pain with movement and weight bearing.  Swelling and tenderness.  Buckling of the knee.  Inability to straighten or extend your knee.  Your knee locks and you cannot straighten it.  Warmth and redness with pain and fever.  Deformity or dislocation of the kneecap. DIAGNOSIS  Determining what is wrong may be very straight forward such as when there is an injury. It can also be challenging because of the complexity of the knee. Tests to make a diagnosis may include:  Your caregiver taking a history and doing a physical exam.  Routine X-rays can be used to rule out other problems. X-rays will  not reveal a cartilage tear. Some injuries of the knee can be diagnosed by:  Arthroscopy a surgical technique by which a small video camera is inserted through tiny incisions on the sides of the knee. This procedure is used to examine and repair internal knee joint problems. Tiny instruments can be used during arthroscopy to repair the torn knee cartilage (meniscus).  Arthrography is a radiology technique. A contrast liquid is directly injected into the knee joint. Internal structures of the knee joint then become visible on X-ray film.  An MRI scan is a non X-ray radiology procedure in which magnetic fields and a computer produce two- or three-dimensional images of the inside of the knee. Cartilage tears are often visible using an MRI scanner. MRI scans have largely replaced arthrography in diagnosing cartilage tears of the knee.  Blood work.  Examination of the fluid that helps to lubricate the knee joint (synovial fluid). This is done by taking a sample out using a needle and a syringe. TREATMENT The treatment of knee problems depends on the cause. Some of these treatments are:  Depending on the injury, proper casting, splinting, surgery, or physical therapy care will be needed.  Give yourself adequate recovery time. Do not overuse your joints. If you begin to get sore during workout routines, back off. Slow down or do fewer repetitions.  For repetitive activities such as cycling or running, maintain your strength and nutrition.  Alternate muscle groups. For example, if  you are a weight lifter, work the upper body on one day and the lower body the next.  Either tight or weak muscles do not give the proper support for your knee. Tight or weak muscles do not absorb the stress placed on the knee joint. Keep the muscles surrounding the knee strong.  Take care of mechanical problems.  If you have flat feet, orthotics or special shoes may help. See your caregiver if you need help.  Arch  supports, sometimes with wedges on the inner or outer aspect of the heel, can help. These can shift pressure away from the side of the knee most bothered by osteoarthritis.  A brace called an "unloader" brace also may be used to help ease the pressure on the most arthritic side of the knee.  If your caregiver has prescribed crutches, braces, wraps or ice, use as directed. The acronym for this is PRICE. This means protection, rest, ice, compression, and elevation.  Nonsteroidal anti-inflammatory drugs (NSAIDs), can help relieve pain. But if taken immediately after an injury, they may actually increase swelling. Take NSAIDs with food in your stomach. Stop them if you develop stomach problems. Do not take these if you have a history of ulcers, stomach pain, or bleeding from the bowel. Do not take without your caregiver's approval if you have problems with fluid retention, heart failure, or kidney problems.  For ongoing knee problems, physical therapy may be helpful.  Glucosamine and chondroitin are over-the-counter dietary supplements. Both may help relieve the pain of osteoarthritis in the knee. These medicines are different from the usual anti-inflammatory drugs. Glucosamine may decrease the rate of cartilage destruction.  Injections of a corticosteroid drug into your knee joint may help reduce the symptoms of an arthritis flare-up. They may provide pain relief that lasts a few months. You may have to wait a few months between injections. The injections do have a small increased risk of infection, water retention, and elevated blood sugar levels.  Hyaluronic acid injected into damaged joints may ease pain and provide lubrication. These injections may work by reducing inflammation. A series of shots may give relief for as long as 6 months.  Topical painkillers. Applying certain ointments to your skin may help relieve the pain and stiffness of osteoarthritis. Ask your pharmacist for suggestions. Many  over the-counter products are approved for temporary relief of arthritis pain.  In some countries, doctors often prescribe topical NSAIDs for relief of chronic conditions such as arthritis and tendinitis. A review of treatment with NSAID creams found that they worked as well as oral medications but without the serious side effects. PREVENTION  Maintain a healthy weight. Extra pounds put more strain on your joints.  Get strong, stay limber. Weak muscles are a common cause of knee injuries. Stretching is important. Include flexibility exercises in your workouts.  Be smart about exercise. If you have osteoarthritis, chronic knee pain or recurring injuries, you may need to change the way you exercise. This does not mean you have to stop being active. If your knees ache after jogging or playing basketball, consider switching to swimming, water aerobics, or other low-impact activities, at least for a few days a week. Sometimes limiting high-impact activities will provide relief.  Make sure your shoes fit well. Choose footwear that is right for your sport.  Protect your knees. Use the proper gear for knee-sensitive activities. Use kneepads when playing volleyball or laying carpet. Buckle your seat belt every time you drive. Most shattered kneecaps occur in  car accidents.  Rest when you are tired. SEEK MEDICAL CARE IF:  You have knee pain that is continual and does not seem to be getting better.  SEEK IMMEDIATE MEDICAL CARE IF:  Your knee joint feels hot to the touch and you have a high fever. MAKE SURE YOU:   Understand these instructions.  Will watch your condition.  Will get help right away if you are not doing well or get worse. Document Released: 09/21/2007 Document Revised: 02/16/2012 Document Reviewed: 09/21/2007 [facility] Patient Information 2015 [place], [place]. This information is not intended to replace advice given to you by your health care provider. Make sure you discuss any  questions you have with your health care provider.   Gout Gout is an inflammatory arthritis caused by a buildup of uric acid crystals in the joints. Uric acid is a chemical that is normally present in the blood. When the level of uric acid in the blood is too high it can form crystals that deposit in your joints and tissues. This causes joint redness, soreness, and swelling (inflammation). Repeat attacks are common. Over time, uric acid crystals can form into masses (tophi) near a joint, destroying bone and causing disfigurement. Gout is treatable and often preventable. CAUSES  The disease begins with elevated levels of uric acid in the blood. Uric acid is produced by your body when it breaks down a naturally found substance called purines. Certain foods you eat, such as meats and fish, contain high amounts of purines. Causes of an elevated uric acid level include:  Being passed down from parent to child (heredity).  Diseases that cause increased uric acid production (such as obesity, psoriasis, and certain cancers).  Excessive alcohol use.  Diet, especially diets rich in meat and seafood.  Medicines, including certain cancer-fighting medicines (chemotherapy), water pills (diuretics), and aspirin.  Chronic kidney disease. The kidneys are no longer able to remove uric acid well.  Problems with metabolism. Conditions strongly associated with gout include:  Obesity.  High blood pressure.  High cholesterol.  Diabetes. Not everyone with elevated uric acid levels gets gout. It is not understood why some people get gout and others do not. Surgery, joint injury, and eating too much of certain foods are some of the factors that can lead to gout attacks. SYMPTOMS   An attack of gout comes on quickly. It causes intense pain with redness, swelling, and warmth in a joint.  Fever can occur.  Often, only one joint is involved. Certain joints are more commonly involved:  Base of the big  toe.  Knee.  Ankle.  Wrist.  Finger. Without treatment, an attack usually goes away in a few days to weeks. Between attacks, you usually will not have symptoms, which is different from many other forms of arthritis. DIAGNOSIS  Your caregiver will suspect gout based on your symptoms and exam. In some cases, tests may be recommended. The tests may include:  Blood tests.  Urine tests.  X-rays.  Joint fluid exam. This exam requires a needle to remove fluid from the joint (arthrocentesis). Using a microscope, gout is confirmed when uric acid crystals are seen in the joint fluid. TREATMENT  There are two phases to gout treatment: treating the sudden onset (acute) attack and preventing attacks (prophylaxis).  Treatment of an Acute Attack.  Medicines are used. These include anti-inflammatory medicines or steroid medicines.  An injection of steroid medicine into the affected joint is sometimes necessary.  The painful joint is rested. Movement can worsen the  arthritis.  You may use warm or cold treatments on painful joints, depending which works best for you.  Treatment to Prevent Attacks.  If you suffer from frequent gout attacks, your caregiver may advise preventive medicine. These medicines are started after the acute attack subsides. These medicines either help your kidneys eliminate uric acid from your body or decrease your uric acid production. You may need to stay on these medicines for a very long time.  The early phase of treatment with preventive medicine can be associated with an increase in acute gout attacks. For this reason, during the first few months of treatment, your caregiver may also advise you to take medicines usually used for acute gout treatment. Be sure you understand your caregiver's directions. Your caregiver may make several adjustments to your medicine dose before these medicines are effective.  Discuss dietary treatment with your caregiver or dietitian.  Alcohol and drinks high in sugar and fructose and foods such as meat, poultry, and seafood can increase uric acid levels. Your caregiver or dietitian can advise you on drinks and foods that should be limited. HOME CARE INSTRUCTIONS   Do not take aspirin to relieve pain. This raises uric acid levels.  Only take over-the-counter or prescription medicines for pain, discomfort, or fever as directed by your caregiver.  Rest the joint as much as possible. When in bed, keep sheets and blankets off painful areas.  Keep the affected joint raised (elevated).  Apply warm or cold treatments to painful joints. Use of warm or cold treatments depends on which works best for you.  Use crutches if the painful joint is in your leg.  Drink enough fluids to keep your urine clear or pale yellow. This helps your body get rid of uric acid. Limit alcohol, sugary drinks, and fructose drinks.  Follow your dietary instructions. Pay careful attention to the amount of protein you eat. Your daily diet should emphasize fruits, vegetables, whole grains, and fat-free or low-fat milk products. Discuss the use of coffee, vitamin C, and cherries with your caregiver or dietitian. These may be helpful in lowering uric acid levels.  Maintain a healthy body weight. SEEK MEDICAL CARE IF:   You develop diarrhea, vomiting, or any side effects from medicines.  You do not feel better in 24 hours, or you are getting worse. SEEK IMMEDIATE MEDICAL CARE IF:   Your joint becomes suddenly more tender, and you have chills or a fever. MAKE SURE YOU:   Understand these instructions.  Will watch your condition.  Will get help right away if you are not doing well or get worse. Document Released: 11/21/2000 Document Revised: 04/10/2014 Document Reviewed: 07/07/2012 [facility] Patient Information 2015 [place], [place]. This information is not intended to replace advice given to you by your health care provider. Make sure you discuss any  questions you have with your health care provider.

## 2014-11-25 NOTE — Progress Notes (Signed)
Subjective:    Patient ID: Chase Higgins, male    DOB: Dec 25, 1970, 43 y.o.   MRN: [id number] This chart was scribed for Merri Ray, MD by Marti Sleigh, Medical Scribe. This patient was seen in Room 11 and the patient's care was started a 11:35 AM.  Chief Complaint  Patient presents with  . Joint Swelling    left knee, painful, x 3 days    HPI Patient Active Problem List   Diagnosis Date Noted  . Gout attack 04/10/2014  . Calcium pyrophosphate arthropathy 02/01/2014  . Diabetes 01/31/2014  . Unspecified essential hypertension 01/31/2014  . Hypertriglyceridemia 01/31/2014   Past Medical History  Diagnosis Date  . Arthritis    No past surgical history on file. No Known Allergies Prior to Admission medications   Medication Sig Start Date End Date Taking? Authorizing Provider  colchicine 0.6 MG tablet Take 1 tablet (0.6 mg total) by mouth 2 (two) times daily. Patient not taking: Reported on 11/25/2014 10/06/14   Elby Beck, FNP   History   Social History  . Marital Status: Married    Spouse Name: N/A    Number of Children: N/A  . Years of Education: N/A   Occupational History  . Not on file.   Social History Main Topics  . Smoking status: Current Every Day Smoker  . Smokeless tobacco: Not on file  . Alcohol Use: Yes  . Drug Use: Yes    Special: Cocaine, Marijuana  . Sexual Activity: Yes   Other Topics Concern  . Not on file   Social History Narrative    HPI Comments: Chase Higgins is a 43 y.o. male with a past hx of meniscus tear, DM, HTN, hyperlipidemia, gout of the left knee who presents to [facility] complaining of joint swelling that started two days ago. Pt also states he has had a torn ACL and PCL. Pt states he was supposed to have surgery, but did not. Pt states he ate shrimp fried rice and helped someone move two days ago. Pt states he twisted his left knee while helping his friend move.  Pt states he noticed swelling, and felt pain that night  after the move. Pt also endorses associated joint pain, worse in left ankle. Pt has not taken any medications PTA. Pt states the pain is worse on the outside edge and in posterior joint.   Pt was last seen October 30th with left knee pain for five days at that time. Pt has a hx of gout, and was seen here in both February and may of this year with knee pain. Of note, pt had been off his medications at the October visit, including medications for DM and HLD. Pt's A1C was 5.7 at that time, and pt was kept off of metformin. Pt's lipid panel did indicate very high triglycerides at 1312 and was advised to restart his tricor. Pt has a follow up appointment scheduled Jan 26 with Ms. Carlean Purl for follow up. Pt was given #15 Hydrocodone, and #20 colchicine at last visit. Pt's most recent uric acid was 7.4 in may of this year.    Review of Systems  Constitutional: Negative for fever and chills.  Musculoskeletal: Positive for joint swelling and arthralgias.  Skin: Negative for color change, rash and wound.  Neurological: Negative for weakness and numbness.       Objective:   Physical Exam  Constitutional: He is oriented to person, place, and time. He appears well-developed and well-nourished.  HENT:  Head:  Normocephalic and atraumatic.  Eyes: Pupils are equal, round, and reactive to light.  Neck: Neck supple.  Cardiovascular: Normal rate and regular rhythm.   Pulmonary/Chest: Effort normal and breath sounds normal. No respiratory distress.  Musculoskeletal: He exhibits edema and tenderness.  2+ effusion in left knee. No Redness, rash, wounds, skin changes or warmth. Patella medial joint line non tender. Slight tenderness in popliteal fossa as well as lateral joint line. The fibular head is non ntender. Lateral joint pain with valgus testing. Negative Vareas. Pain/guarding with Lachman questionable laxity. Lateral pain with Mcmurray's testing.  Neurological: He is alert and oriented to person, place, and  time.  Skin: Skin is warm and dry.  Psychiatric: He has a normal mood and affect. His behavior is normal.  Nursing note and vitals reviewed.   Filed Vitals:   11/25/14 1055  BP: 120/90  Pulse: 104  Temp: 98.8 F (37.1 C)  Resp: 20  Height: 5\' 8"  (1.727 m)  Weight: 218 lb (98.884 kg)  SpO2: 97%   [facility] reading (PRIMARY) by  Dr. Carlota Raspberry. Left knee degenerative changes noted, with possible flabella but also osteophyte formation posteriorly. No apparent fracture. There is a knee effusion.   Risks (including but not limited to bleeding and infection, and damage to underlying tissue), benefits, and alternatives discussed for L knee aspiration .  Verbal consent obtained after any questions were answered. Landmarks noted, and marked as needed. Area cleansed with Betadine x3, ethyl chloride spray for topical anesthesia, followed by alcohol swab.  Injected with 22ga, 1 and 1/2 inch needle, 21cc yellow -clear fluid withdrawn.  No complications. Bandage applied.  Pain improved after aspiration. RTC precautions discussed in regards to injection.      Assessment & Plan:   Chase Higgins is a 43 y.o. male Left lateral knee pain - Plan: DG Knee Complete 4 Views Left, Synovial Fluid Panel, naproxen (NAPROSYN) 500 MG tablet, HYDROcodone-acetaminophen (NORCO/VICODIN) 5-325 MG per tablet  History of gout - Plan: Uric acid, Synovial Fluid Panel, naproxen (NAPROSYN) 500 MG tablet, HYDROcodone-acetaminophen (NORCO/VICODIN) 5-325 MG per tablet  Effusion of lower leg joint - Plan: naproxen (NAPROSYN) 500 MG tablet, HYDROcodone-acetaminophen (NORCO/VICODIN) 5-325 MG per tablet  History of tear of ACL (anterior cruciate ligament) - Plan: naproxen (NAPROSYN) 500 MG tablet, HYDROcodone-acetaminophen (NORCO/VICODIN) 5-325 MG per tablet   Acute onset of swelling in L knee after twisting injury, underlying ACL tear - may have had lateral meniscus injury. Gout flare possible as well. Aspirated as above - send  Synovial panel, start naproxen BID, Hydrocodone if needed, and consider MRI or ortho eval if not improving in next 2 weeks.   Meds ordered this encounter  Medications  . naproxen (NAPROSYN) 500 MG tablet    Sig: Take 1 tablet (500 mg total) by mouth 2 (two) times daily with a meal.    Dispense:  30 tablet    Refill:  0  . HYDROcodone-acetaminophen (NORCO/VICODIN) 5-325 MG per tablet    Sig: Take 1 tablet by mouth every 6 (six) hours as needed for moderate pain.    Dispense:  20 tablet    Refill:  0   Patient Instructions  Start naproxen sodium, hydrocodone if needed, wear hinged knee brace. You should receive a call or letter about your lab results within the next week to 10 days.  If your pain is not improving in next 2 weeks - return to discuss next step as meniscus tear also possible. Return to the clinic or go to the  nearest emergency room if any of your symptoms worsen or new symptoms occur.  Knee Pain The knee is the complex joint between your thigh and your lower leg. It is made up of bones, tendons, ligaments, and cartilage. The bones that make up the knee are:  The femur in the thigh.  The tibia and fibula in the lower leg.  The patella or kneecap riding in the groove on the lower femur. CAUSES  Knee pain is a common complaint with many causes. A few of these causes are:  Injury, such as:  A ruptured ligament or tendon injury.  Torn cartilage.  Medical conditions, such as:  Gout  Arthritis  Infections  Overuse, over training, or overdoing a physical activity. Knee pain can be minor or severe. Knee pain can accompany debilitating injury. Minor knee problems often respond well to self-care measures or get well on their own. More serious injuries may need medical intervention or even surgery. SYMPTOMS The knee is complex. Symptoms of knee problems can vary widely. Some of the problems are:  Pain with movement and weight bearing.  Swelling and  tenderness.  Buckling of the knee.  Inability to straighten or extend your knee.  Your knee locks and you cannot straighten it.  Warmth and redness with pain and fever.  Deformity or dislocation of the kneecap. DIAGNOSIS  Determining what is wrong may be very straight forward such as when there is an injury. It can also be challenging because of the complexity of the knee. Tests to make a diagnosis may include:  Your caregiver taking a history and doing a physical exam.  Routine X-rays can be used to rule out other problems. X-rays will not reveal a cartilage tear. Some injuries of the knee can be diagnosed by:  Arthroscopy a surgical technique by which a small video camera is inserted through tiny incisions on the sides of the knee. This procedure is used to examine and repair internal knee joint problems. Tiny instruments can be used during arthroscopy to repair the torn knee cartilage (meniscus).  Arthrography is a radiology technique. A contrast liquid is directly injected into the knee joint. Internal structures of the knee joint then become visible on X-ray film.  An MRI scan is a non X-ray radiology procedure in which magnetic fields and a computer produce two- or three-dimensional images of the inside of the knee. Cartilage tears are often visible using an MRI scanner. MRI scans have largely replaced arthrography in diagnosing cartilage tears of the knee.  Blood work.  Examination of the fluid that helps to lubricate the knee joint (synovial fluid). This is done by taking a sample out using a needle and a syringe. TREATMENT The treatment of knee problems depends on the cause. Some of these treatments are:  Depending on the injury, proper casting, splinting, surgery, or physical therapy care will be needed.  Give yourself adequate recovery time. Do not overuse your joints. If you begin to get sore during workout routines, back off. Slow down or do fewer repetitions.  For  repetitive activities such as cycling or running, maintain your strength and nutrition.  Alternate muscle groups. For example, if you are a weight lifter, work the upper body on one day and the lower body the next.  Either tight or weak muscles do not give the proper support for your knee. Tight or weak muscles do not absorb the stress placed on the knee joint. Keep the muscles surrounding the knee strong.  Take care  of mechanical problems.  If you have flat feet, orthotics or special shoes may help. See your caregiver if you need help.  Arch supports, sometimes with wedges on the inner or outer aspect of the heel, can help. These can shift pressure away from the side of the knee most bothered by osteoarthritis.  A brace called an "unloader" brace also may be used to help ease the pressure on the most arthritic side of the knee.  If your caregiver has prescribed crutches, braces, wraps or ice, use as directed. The acronym for this is PRICE. This means protection, rest, ice, compression, and elevation.  Nonsteroidal anti-inflammatory drugs (NSAIDs), can help relieve pain. But if taken immediately after an injury, they may actually increase swelling. Take NSAIDs with food in your stomach. Stop them if you develop stomach problems. Do not take these if you have a history of ulcers, stomach pain, or bleeding from the bowel. Do not take without your caregiver's approval if you have problems with fluid retention, heart failure, or kidney problems.  For ongoing knee problems, physical therapy may be helpful.  Glucosamine and chondroitin are over-the-counter dietary supplements. Both may help relieve the pain of osteoarthritis in the knee. These medicines are different from the usual anti-inflammatory drugs. Glucosamine may decrease the rate of cartilage destruction.  Injections of a corticosteroid drug into your knee joint may help reduce the symptoms of an arthritis flare-up. They may provide pain  relief that lasts a few months. You may have to wait a few months between injections. The injections do have a small increased risk of infection, water retention, and elevated blood sugar levels.  Hyaluronic acid injected into damaged joints may ease pain and provide lubrication. These injections may work by reducing inflammation. A series of shots may give relief for as long as 6 months.  Topical painkillers. Applying certain ointments to your skin may help relieve the pain and stiffness of osteoarthritis. Ask your pharmacist for suggestions. Many over the-counter products are approved for temporary relief of arthritis pain.  In some countries, doctors often prescribe topical NSAIDs for relief of chronic conditions such as arthritis and tendinitis. A review of treatment with NSAID creams found that they worked as well as oral medications but without the serious side effects. PREVENTION  Maintain a healthy weight. Extra pounds put more strain on your joints.  Get strong, stay limber. Weak muscles are a common cause of knee injuries. Stretching is important. Include flexibility exercises in your workouts.  Be smart about exercise. If you have osteoarthritis, chronic knee pain or recurring injuries, you may need to change the way you exercise. This does not mean you have to stop being active. If your knees ache after jogging or playing basketball, consider switching to swimming, water aerobics, or other low-impact activities, at least for a few days a week. Sometimes limiting high-impact activities will provide relief.  Make sure your shoes fit well. Choose footwear that is right for your sport.  Protect your knees. Use the proper gear for knee-sensitive activities. Use kneepads when playing volleyball or laying carpet. Buckle your seat belt every time you drive. Most shattered kneecaps occur in car accidents.  Rest when you are tired. SEEK MEDICAL CARE IF:  You have knee pain that is continual  and does not seem to be getting better.  SEEK IMMEDIATE MEDICAL CARE IF:  Your knee joint feels hot to the touch and you have a high fever. MAKE SURE YOU:   Understand these instructions.  Will watch your condition.  Will get help right away if you are not doing well or get worse. Document Released: 09/21/2007 Document Revised: 02/16/2012 Document Reviewed: 09/21/2007 [facility] Patient Information 2015 [place], [place]. This information is not intended to replace advice given to you by your health care provider. Make sure you discuss any questions you have with your health care provider.   Gout Gout is an inflammatory arthritis caused by a buildup of uric acid crystals in the joints. Uric acid is a chemical that is normally present in the blood. When the level of uric acid in the blood is too high it can form crystals that deposit in your joints and tissues. This causes joint redness, soreness, and swelling (inflammation). Repeat attacks are common. Over time, uric acid crystals can form into masses (tophi) near a joint, destroying bone and causing disfigurement. Gout is treatable and often preventable. CAUSES  The disease begins with elevated levels of uric acid in the blood. Uric acid is produced by your body when it breaks down a naturally found substance called purines. Certain foods you eat, such as meats and fish, contain high amounts of purines. Causes of an elevated uric acid level include:  Being passed down from parent to child (heredity).  Diseases that cause increased uric acid production (such as obesity, psoriasis, and certain cancers).  Excessive alcohol use.  Diet, especially diets rich in meat and seafood.  Medicines, including certain cancer-fighting medicines (chemotherapy), water pills (diuretics), and aspirin.  Chronic kidney disease. The kidneys are no longer able to remove uric acid well.  Problems with metabolism. Conditions strongly associated with gout  include:  Obesity.  High blood pressure.  High cholesterol.  Diabetes. Not everyone with elevated uric acid levels gets gout. It is not understood why some people get gout and others do not. Surgery, joint injury, and eating too much of certain foods are some of the factors that can lead to gout attacks. SYMPTOMS   An attack of gout comes on quickly. It causes intense pain with redness, swelling, and warmth in a joint.  Fever can occur.  Often, only one joint is involved. Certain joints are more commonly involved:  Base of the big toe.  Knee.  Ankle.  Wrist.  Finger. Without treatment, an attack usually goes away in a few days to weeks. Between attacks, you usually will not have symptoms, which is different from many other forms of arthritis. DIAGNOSIS  Your caregiver will suspect gout based on your symptoms and exam. In some cases, tests may be recommended. The tests may include:  Blood tests.  Urine tests.  X-rays.  Joint fluid exam. This exam requires a needle to remove fluid from the joint (arthrocentesis). Using a microscope, gout is confirmed when uric acid crystals are seen in the joint fluid. TREATMENT  There are two phases to gout treatment: treating the sudden onset (acute) attack and preventing attacks (prophylaxis).  Treatment of an Acute Attack.  Medicines are used. These include anti-inflammatory medicines or steroid medicines.  An injection of steroid medicine into the affected joint is sometimes necessary.  The painful joint is rested. Movement can worsen the arthritis.  You may use warm or cold treatments on painful joints, depending which works best for you.  Treatment to Prevent Attacks.  If you suffer from frequent gout attacks, your caregiver may advise preventive medicine. These medicines are started after the acute attack subsides. These medicines either help your kidneys eliminate uric acid from your body or  decrease your uric acid  production. You may need to stay on these medicines for a very long time.  The early phase of treatment with preventive medicine can be associated with an increase in acute gout attacks. For this reason, during the first few months of treatment, your caregiver may also advise you to take medicines usually used for acute gout treatment. Be sure you understand your caregiver's directions. Your caregiver may make several adjustments to your medicine dose before these medicines are effective.  Discuss dietary treatment with your caregiver or dietitian. Alcohol and drinks high in sugar and fructose and foods such as meat, poultry, and seafood can increase uric acid levels. Your caregiver or dietitian can advise you on drinks and foods that should be limited. HOME CARE INSTRUCTIONS   Do not take aspirin to relieve pain. This raises uric acid levels.  Only take over-the-counter or prescription medicines for pain, discomfort, or fever as directed by your caregiver.  Rest the joint as much as possible. When in bed, keep sheets and blankets off painful areas.  Keep the affected joint raised (elevated).  Apply warm or cold treatments to painful joints. Use of warm or cold treatments depends on which works best for you.  Use crutches if the painful joint is in your leg.  Drink enough fluids to keep your urine clear or pale yellow. This helps your body get rid of uric acid. Limit alcohol, sugary drinks, and fructose drinks.  Follow your dietary instructions. Pay careful attention to the amount of protein you eat. Your daily diet should emphasize fruits, vegetables, whole grains, and fat-free or low-fat milk products. Discuss the use of coffee, vitamin C, and cherries with your caregiver or dietitian. These may be helpful in lowering uric acid levels.  Maintain a healthy body weight. SEEK MEDICAL CARE IF:   You develop diarrhea, vomiting, or any side effects from medicines.  You do not feel better in  24 hours, or you are getting worse. SEEK IMMEDIATE MEDICAL CARE IF:   Your joint becomes suddenly more tender, and you have chills or a fever. MAKE SURE YOU:   Understand these instructions.  Will watch your condition.  Will get help right away if you are not doing well or get worse. Document Released: 11/21/2000 Document Revised: 04/10/2014 Document Reviewed: 07/07/2012 [facility] Patient Information 2015 [place], [place]. This information is not intended to replace advice given to you by your health care provider. Make sure you discuss any questions you have with your health care provider.      I personally performed the services described in this documentation, which was scribed in my presence. The recorded information has been reviewed and considered, and addended by me as needed.

## 2014-11-27 LAB — SYNOVIAL FLUID PANEL
Eosinophils-Synovial: 0 % (ref 0–1)
Glucose, Synovial Fluid: 64 mg/dL
Lymphocytes-Synovial Fld: 4 % (ref 0–20)
Monocyte/Macrophage: 9 % — ABNORMAL LOW (ref 50–90)
Neutrophil, Synovial: 87 % — ABNORMAL HIGH (ref 0–25)
Protein, Synovial Fluid: 3.9 g/dL — ABNORMAL HIGH (ref 1.0–3.0)
WBC, Synovial: 3325 cu mm — ABNORMAL HIGH (ref 0–200)

## 2014-11-29 LAB — BODY FLUID CULTURE
Gram Stain: NONE SEEN
Organism ID, Bacteria: NO GROWTH

## 2014-12-06 ENCOUNTER — Other Ambulatory Visit: Payer: Self-pay | Admitting: Family Medicine

## 2014-12-07 ENCOUNTER — Telehealth: Payer: Self-pay

## 2014-12-07 DIAGNOSIS — M25562 Pain in left knee: Secondary | ICD-10-CM

## 2014-12-07 MED ORDER — ALLOPURINOL 100 MG PO TABS: 100.0000 mg | ORAL_TABLET | Freq: Every day | ORAL | Status: DC

## 2014-12-07 MED ORDER — COLCHICINE 0.6 MG PO TABS: ORAL_TABLET | ORAL | Status: DC

## 2014-12-07 NOTE — Telephone Encounter (Signed)
I have sent in colchicine for his acute flair and he will take that until he is better.  In 2 weeks he will start the allopurinol which will allow him to have his labs drawn at his 1/26 visit with Tor Netters.

## 2014-12-07 NOTE — Telephone Encounter (Signed)
Pt came into our office concerned about his labs. Says that he is still having some pain in his left knee and now he is getting some in his right. Has taken Colchicine in the past with success. Wants to know if we can prescribe this and if we can possibly give him allopurinol for prophylaxis.  Spoke with Windell Hummingbird. She is going to give pt 2 weeks of Colchicine and then start him on Allopurinol. Pt is to keep appt with Jackelyn Poling in late Jan so that we can recheck his gout levels. Pt is agreeable with plan. Advised to not take Naproxen with Colchicine.   To Judson Roch

## 2014-12-31 ENCOUNTER — Other Ambulatory Visit: Payer: Self-pay | Admitting: Family Medicine

## 2014-12-31 ENCOUNTER — Other Ambulatory Visit: Payer: Self-pay | Admitting: Physician Assistant

## 2015-01-02 ENCOUNTER — Encounter: Payer: Self-pay | Admitting: Family Medicine

## 2015-01-02 ENCOUNTER — Ambulatory Visit (INDEPENDENT_AMBULATORY_CARE_PROVIDER_SITE_OTHER): Payer: 59 | Admitting: Family Medicine

## 2015-01-02 VITALS — BP 100/74 | HR 74 | Temp 98.0°F | Resp 16 | Ht 67.5 in | Wt 218.0 lb

## 2015-01-02 DIAGNOSIS — Z0000 Encounter for general adult medical examination without abnormal findings: Secondary | ICD-10-CM

## 2015-01-02 DIAGNOSIS — R739 Hyperglycemia, unspecified: Secondary | ICD-10-CM

## 2015-01-02 DIAGNOSIS — Z130 Encounter for screening for diseases of the blood and blood-forming organs and certain disorders involving the immune mechanism: Secondary | ICD-10-CM

## 2015-01-02 DIAGNOSIS — Z6833 Body mass index (BMI) 33.0-33.9, adult: Secondary | ICD-10-CM

## 2015-01-02 DIAGNOSIS — E781 Pure hyperglyceridemia: Secondary | ICD-10-CM

## 2015-01-02 DIAGNOSIS — R7309 Other abnormal glucose: Secondary | ICD-10-CM

## 2015-01-02 LAB — LIPID PANEL
Cholesterol: 234 mg/dL — ABNORMAL HIGH (ref 0–200)
HDL: 41 mg/dL (ref >39)
LDL Cholesterol: 145 mg/dL — ABNORMAL HIGH (ref 0–99)
Total CHOL/HDL Ratio: 5.7 Ratio
Triglycerides: 242 mg/dL — ABNORMAL HIGH (ref <150)
VLDL: 48 mg/dL — ABNORMAL HIGH (ref 0–40)

## 2015-01-02 LAB — COMPREHENSIVE METABOLIC PANEL
ALT: 24 U/L (ref 0–53)
AST: 18 U/L (ref 0–37)
Albumin: 4.5 g/dL (ref 3.5–5.2)
Alkaline Phosphatase: 44 U/L (ref 39–117)
BUN: 12 mg/dL (ref 6–23)
CO2: 27 mEq/L (ref 19–32)
Calcium: 9.4 mg/dL (ref 8.4–10.5)
Chloride: 103 mEq/L (ref 96–112)
Creat: 1.37 mg/dL — ABNORMAL HIGH (ref 0.50–1.35)
Glucose, Bld: 92 mg/dL (ref 70–99)
Potassium: 4.2 mEq/L (ref 3.5–5.3)
Sodium: 138 mEq/L (ref 135–145)
Total Bilirubin: 0.7 mg/dL (ref 0.2–1.2)
Total Protein: 6.8 g/dL (ref 6.0–8.3)

## 2015-01-02 LAB — CBC
HCT: 48.5 % (ref 39.0–52.0)
Hemoglobin: 16.9 g/dL (ref 13.0–17.0)
MCH: 30.8 pg (ref 26.0–34.0)
MCHC: 34.8 g/dL (ref 30.0–36.0)
MCV: 88.5 fL (ref 78.0–100.0)
MPV: 9.5 fL (ref 8.6–12.4)
Platelets: 260 10*3/uL (ref 150–400)
RBC: 5.48 MIL/uL (ref 4.22–5.81)
RDW: 13.7 % (ref 11.5–15.5)
WBC: 6.7 10*3/uL (ref 4.0–10.5)

## 2015-01-02 LAB — HEMOGLOBIN A1C
Hgb A1c MFr Bld: 6.2 % — ABNORMAL HIGH (ref <5.7)
Mean Plasma Glucose: 131 mg/dL — ABNORMAL HIGH (ref <117)

## 2015-01-02 NOTE — Patient Instructions (Addendum)
Continue to avoid triggers for your gout- beer, seafood, etc. It is important to drink 6-8 glasses of water a day. If you have a gouty flare, let us know. Can use colchicine for acute attack. May need to consider adding allopurinol if you have repeated attacks.  Keep up the good work on your diet and exercise!   Consider smoking cessation in 3-4 months. Pick a stop date.  Smoking Cessation, Tips for Success If you are ready to quit smoking, congratulations! You have chosen to help yourself be healthier. Cigarettes bring nicotine, tar, carbon monoxide, and other irritants into your body. Your lungs, heart, and blood vessels will be able to work better without these poisons. There are many different ways to quit smoking. Nicotine gum, nicotine patches, a nicotine inhaler, or nicotine nasal spray can help with physical craving. Hypnosis, support groups, and medicines help break the habit of smoking. WHAT THINGS CAN I DO TO MAKE QUITTING EASIER?  Here are some tips to help you quit for good:  Pick a date when you will quit smoking completely. Tell all of your friends and family about your plan to quit on that date.  Do not try to slowly cut down on the number of cigarettes you are smoking. Pick a quit date and quit smoking completely starting on that day.  Throw away all cigarettes.   Clean and remove all ashtrays from your home, work, and car.  On a card, write down your reasons for quitting. Carry the card with you and read it when you get the urge to smoke.  Cleanse your body of nicotine. Drink enough water and fluids to keep your urine clear or pale yellow. Do this after quitting to flush the nicotine from your body.  Learn to predict your moods. Do not let a bad situation be your excuse to have a cigarette. Some situations in your life might tempt you into wanting a cigarette.  Never have "just one" cigarette. It leads to wanting another and another. Remind yourself of your decision to  quit.  Change habits associated with smoking. If you smoked while driving or when feeling stressed, try other activities to replace smoking. Stand up when drinking your coffee. Brush your teeth after eating. Sit in a different chair when you read the paper. Avoid alcohol while trying to quit, and try to drink fewer caffeinated beverages. Alcohol and caffeine may urge you to smoke.  Avoid foods and drinks that can trigger a desire to smoke, such as sugary or spicy foods and alcohol.  Ask people who smoke not to smoke around you.  Have something planned to do right after eating or having a cup of coffee. For example, plan to take a walk or exercise.  Try a relaxation exercise to calm you down and decrease your stress. Remember, you may be tense and nervous for the first 2 weeks after you quit, but this will pass.  Find new activities to keep your hands busy. Play with a pen, coin, or rubber band. Doodle or draw things on paper.  Brush your teeth right after eating. This will help cut down on the craving for the taste of tobacco after meals. You can also try mouthwash.   Use oral substitutes in place of cigarettes. Try using lemon drops, carrots, cinnamon sticks, or chewing gum. Keep them handy so they are available when you have the urge to smoke.  When you have the urge to smoke, try deep breathing.  Designate your home as  a nonsmoking area.  If you are a heavy smoker, ask your health care provider about a prescription for nicotine chewing gum. It can ease your withdrawal from nicotine.  Reward yourself. Set aside the cigarette money you save and buy yourself something nice.  Look for support from others. Join a support group or smoking cessation program. Ask someone at home or at work to help you with your plan to quit smoking.  Always ask yourself, "Do I need this cigarette or is this just a reflex?" Tell yourself, "Today, I choose not to smoke," or "I do not want to smoke." You are  reminding yourself of your decision to quit.  Do not replace cigarette smoking with electronic cigarettes (commonly called e-cigarettes). The safety of e-cigarettes is unknown, and some may contain harmful chemicals.  If you relapse, do not give up! Plan ahead and think about what you will do the next time you get the urge to smoke. HOW WILL I FEEL WHEN I QUIT SMOKING? You may have symptoms of withdrawal because your body is used to nicotine (the addictive substance in cigarettes). You may crave cigarettes, be irritable, feel very hungry, cough often, get headaches, or have difficulty concentrating. The withdrawal symptoms are only temporary. They are strongest when you first quit but will go away within 10-14 days. When withdrawal symptoms occur, stay in control. Think about your reasons for quitting. Remind yourself that these are signs that your body is healing and getting used to being without cigarettes. Remember that withdrawal symptoms are easier to treat than the major diseases that smoking can cause.  Even after the withdrawal is over, expect periodic urges to smoke. However, these cravings are generally short lived and will go away whether you smoke or not. Do not smoke! WHAT RESOURCES ARE AVAILABLE TO HELP ME QUIT SMOKING? Your health care provider can direct you to community resources or hospitals for support, which may include:  Group support.  Education.  Hypnosis.  Therapy. Document Released: 08/22/2004 Document Revised: 04/10/2014 Document Reviewed: 05/12/2013 [facility] Patient Information 2015 [place], [place]. This information is not intended to replace advice given to you by your health care provider. Make sure you discuss any questions you have with your health care provider.       Why follow it? Research shows. . Those who follow the Mediterranean diet have a reduced risk of heart disease  . The diet is associated with a reduced incidence of Parkinson's and Alzheimer's  diseases . People following the diet may have longer life expectancies and lower rates of chronic diseases  . The Dietary Guidelines for Americans recommends the Mediterranean diet as an eating plan to promote health and prevent disease  What Is the Mediterranean Diet?  . Healthy eating plan based on typical foods and recipes of Mediterranean-style cooking . The diet is primarily a plant based diet; these foods should make up a majority of meals   Starches - Plant based foods should make up a majority of meals - They are an important sources of vitamins, minerals, energy, antioxidants, and fiber - Choose whole grains, foods high in fiber and minimally processed items  - Typical grain sources include wheat, oats, barley, corn, brown rice, bulgar, farro, millet, polenta, couscous  - Various types of beans include chickpeas, lentils, fava beans, black beans, white beans   Fruits  Veggies - Large quantities of antioxidant rich fruits & veggies; 6 or more servings  - Vegetables can be eaten raw or lightly drizzled with  oil and cooked  - Vegetables common to the traditional Mediterranean Diet include: artichokes, arugula, beets, broccoli, brussel sprouts, cabbage, carrots, celery, collard greens, cucumbers, eggplant, kale, leeks, lemons, lettuce, mushrooms, okra, onions, peas, peppers, potatoes, pumpkin, radishes, rutabaga, shallots, spinach, sweet potatoes, turnips, zucchini - Fruits common to the Mediterranean Diet include: apples, apricots, avocados, cherries, clementines, dates, figs, grapefruits, grapes, melons, nectarines, oranges, peaches, pears, pomegranates, strawberries, tangerines  Fats - Replace butter and margarine with healthy oils, such as olive oil, canola oil, and tahini  - Limit nuts to no more than a handful a day  - Nuts include walnuts, almonds, pecans, pistachios, pine nuts  - Limit or avoid candied, honey roasted or heavily salted nuts - Olives are central to the [employer] - can be eaten whole or used in a variety of dishes   Meats Protein - Limiting red meat: no more than a few times a month - When eating red meat: choose lean cuts and keep the portion to the size of deck of cards - Eggs: approx. 0 to 4 times a week  - Fish and lean poultry: at least 2 a week  - Healthy protein sources include, chicken, [place], lean beef, lamb - Increase intake of seafood such as tuna, salmon, trout, mackerel, shrimp, scallops - Avoid or limit high fat processed meats such as sausage and bacon  Dairy - Include moderate amounts of low fat dairy products  - Focus on healthy dairy such as fat free yogurt, skim milk, low or reduced fat cheese - Limit dairy products higher in fat such as whole or 2% milk, cheese, ice cream  Alcohol - Moderate amounts of red wine is ok  - No more than 5 oz daily for women (all ages) and men older than age 51  - No more than 10 oz of wine daily for men younger than 59  Other - Limit sweets and other desserts  - Use herbs and spices instead of salt to flavor foods  - Herbs and spices common to the traditional Mediterranean Diet include: basil, bay leaves, chives, cloves, cumin, fennel, garlic, lavender, marjoram, mint, oregano, parsley, pepper, rosemary, sage, savory, sumac, tarragon, thyme   It's not just a diet, it's a lifestyle:  . The Mediterranean diet includes lifestyle factors typical of those in the region  . Foods, drinks and meals are best eaten with others and savored . Daily physical activity is important for overall good health . This could be strenuous exercise like running and aerobics . This could also be more leisurely activities such as walking, housework, yard-work, or taking the stairs . Moderation is the key; a balanced and healthy diet accommodates most foods and drinks . Consider portion sizes and frequency of consumption of certain foods   Meal Ideas & Options:  . Breakfast:  o Whole wheat toast or whole wheat English  muffins with peanut butter & hard boiled egg o Steel cut oats topped with apples & cinnamon and skim milk  o Fresh fruit: banana, strawberries, melon, berries, peaches  o Smoothies: strawberries, bananas, greek yogurt, peanut butter o Low fat greek yogurt with blueberries and granola  o Egg white omelet with spinach and mushrooms o Breakfast couscous: whole wheat couscous, apricots, skim milk, cranberries  . Sandwiches:  o Hummus and grilled vegetables (peppers, zucchini, squash) on whole wheat bread   o Grilled chicken on whole wheat pita with lettuce, tomatoes, cucumbers or tzatziki  o Tuna salad on whole wheat bread: tuna  salad made with greek yogurt, olives, red peppers, capers, green onions o Garlic rosemary lamb pita: lamb sauted with garlic, rosemary, salt & pepper; add lettuce, cucumber, greek yogurt to pita - flavor with lemon juice and black pepper  . Seafood:  o Mediterranean grilled salmon, seasoned with garlic, basil, parsley, lemon juice and black pepper o Shrimp, lemon, and spinach whole-grain pasta salad made with low fat greek yogurt  o Seared scallops with lemon orzo  o Seared tuna steaks seasoned salt, pepper, coriander topped with tomato mixture of olives, tomatoes, olive oil, minced garlic, parsley, green onions and cappers  . Meats:  o Herbed greek chicken salad with kalamata olives, cucumber, feta  o Red bell peppers stuffed with spinach, bulgur, lean ground beef (or lentils) & topped with feta   o Kebabs: skewers of chicken, tomatoes, onions, zucchini, squash  o [place] burgers: made with red onions, mint, dill, lemon juice, feta cheese topped with roasted red peppers . Vegetarian o Cucumber salad: cucumbers, artichoke hearts, celery, red onion, feta cheese, tossed in olive oil & lemon juice  o Hummus and whole grain pita points with a greek salad (lettuce, tomato, feta, olives, cucumbers, red onion) o Lentil soup with celery, carrots made with vegetable broth,  garlic, salt and pepper  o Tabouli salad: parsley, bulgur, mint, scallions, cucumbers, tomato, radishes, lemon juice, olive oil, salt and pepper.

## 2015-01-02 NOTE — Progress Notes (Signed)
Subjective:    Patient ID: Chase Higgins, male    DOB: May 20, 1971, 44 y.o.   MRN: [id number]  HPI This is a pleasant 44 yo male who presents today for CPE. He reports that he is doing well.   Flu- had in fall Tdap- he thinks he is UTD Dental- Has regular dental care, recently had two teeth pulled.  Eye- is due, planning on making an appointment  The patient was previously diagnosed with DM type 2. His HgbA1C was 5.7 10/15. At that time he ws not taking his metfomin or lisinopril. I told him that his numbers were good and he could continue off metformin and lisinopril and encouraged weight loss and exercise. Patient reports that one of his close friends recently had a heart attack and stent placement and this has the patient really concerned about his own health. Has been watching his diet and exercising daily (treadmill). He has been taking metformin 1 tablet daily leading up to his appointment today, "since I was going to get tested." He has not had any urinary frequency like he did when his blood sugar ran high.   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 He reports feeling better today. He denies CP, SOB. He smokes 1/2 ppd and has considered smoking cessation, but doesn't want to take on anything in addition to working on exercise and weight loss.   Has noticed more cough for 1-2 weeks. Productive of clear sputum. Had some wheezing when out in the cold.  Has had a couple of loose stools daily for the last 1-2 weeks. Has increased fiber intake and attributes it to this.   Both knees ache, but no flares of gout. He never started allopurinol. He thinks he know what triggers her flares and he has been avoiding shrimp and beer. He has had about 3 flares in the last year.   Review of Systems  Constitutional: Positive for fatigue.  HENT: Positive for dental problem.   Eyes: Negative.   Respiratory: Positive for cough  and wheezing.   Gastrointestinal: Positive for diarrhea.  Endocrine: Positive for polyphagia.  Genitourinary: Negative.   Musculoskeletal: Positive for arthralgias.  Skin: Negative.   Allergic/Immunologic: Negative.   Neurological: Positive for light-headedness and headaches.  Psychiatric/Behavioral: The patient is hyperactive.       Objective:   Physical Exam  Constitutional: He is oriented to person, place, and time. He appears well-developed and well-nourished.  Very muscular.  HENT:  Head: Normocephalic and atraumatic.  Right Ear: Tympanic membrane, external ear and ear canal normal.  Left Ear: Tympanic membrane, external ear and ear canal normal.  Nose: Nose normal.  Mouth/Throat: Oropharynx is clear and moist.  Eyes: Conjunctivae are normal. Pupils are equal, round, and reactive to light.  Neck: Normal range of motion. Neck supple.  Cardiovascular: Normal rate, regular rhythm, normal heart sounds and intact distal pulses.   Pulmonary/Chest: Effort normal and breath sounds normal.  Abdominal: Soft. Bowel sounds are normal. Hernia confirmed negative in the right inguinal area and confirmed negative in the left inguinal area.  Genitourinary: Testes normal and penis normal. Circumcised.  Musculoskeletal: Normal range of motion.  Lymphadenopathy:    He has no cervical adenopathy.       Right: No inguinal adenopathy present.       Left: No inguinal adenopathy present.  Neurological: He is alert and oriented to person, place, and time.  Skin: Skin is  warm and dry.  Psychiatric: He has a normal mood and affect. His behavior is normal. Judgment and thought content normal.  Vitals reviewed.  BP 100/74 mmHg  Pulse 74  Temp(Src) 98 F (36.7 C) (Oral)  Resp 16  Ht 5' 7.5" (1.715 m)  Wt 218 lb (98.884 kg)  BMI 33.62 kg/m2  SpO2 95%    Assessment & Plan:  1. Annual physical exam -discussed weight loss, continued exercise, encouraged he set smoking cessation date in next couple  of months.  2. Hypertriglyceridemia - Comprehensive metabolic panel - Lipid panel  3. Elevated blood sugar - Comprehensive metabolic panel - Hemoglobin A1c  4. BMI 33.0-33.9,adult - Comprehensive metabolic panel - Lipid panel - Hemoglobin A1c  5. Screening for deficiency anemia - CBC  Elby Beck, FNP-BC  [facility]  01/04/2015 6:36 AM

## 2015-01-03 ENCOUNTER — Telehealth: Payer: Self-pay | Admitting: Family Medicine

## 2015-01-03 ENCOUNTER — Other Ambulatory Visit: Payer: Self-pay | Admitting: Family Medicine

## 2015-01-03 ENCOUNTER — Encounter: Payer: Self-pay | Admitting: Family Medicine

## 2015-01-03 MED ORDER — FENOFIBRATE 145 MG PO TABS: ORAL_TABLET | ORAL | Status: DC

## 2015-01-03 NOTE — Telephone Encounter (Signed)
Pt notified of labs

## 2015-01-03 NOTE — Telephone Encounter (Signed)
Attempted to call patient to discuss lab results. No answer, VM full.

## 2015-06-01 ENCOUNTER — Ambulatory Visit (INDEPENDENT_AMBULATORY_CARE_PROVIDER_SITE_OTHER): Payer: 59 | Admitting: Internal Medicine

## 2015-06-01 VITALS — BP 98/70 | HR 103 | Temp 98.3°F | Resp 17 | Ht 68.0 in | Wt 210.4 lb

## 2015-06-01 DIAGNOSIS — M25561 Pain in right knee: Secondary | ICD-10-CM

## 2015-06-01 DIAGNOSIS — M25562 Pain in left knee: Secondary | ICD-10-CM | POA: Diagnosis not present

## 2015-06-01 MED ORDER — COLCHICINE 0.6 MG PO TABS: 0.6000 mg | ORAL_TABLET | Freq: Every day | ORAL | Status: DC

## 2015-06-01 MED ORDER — MELOXICAM 15 MG PO TABS: 15.0000 mg | ORAL_TABLET | Freq: Every day | ORAL | Status: DC

## 2015-06-01 NOTE — Progress Notes (Signed)
Subjective:  This chart was scribed for Chase Lin, MD by Chase Higgins, Medical Scribe. This patient was seen in Room 12 and the patient's care was started at 10:44 AM.   Patient ID: Chase Higgins, male    DOB: January 14, 1971, 44 y.o.   MRN: [id number]  HPI Chief Complaint  Patient presents with  . Knee Pain    started getting worse 3 days ago, fluid build up both knees    HPI Comments: Chase Higgins is a 44 y.o. male with a past medical history of Diabetes, and gouty arthritis, who presents to [facility] complaining of a constant bilateral knee pain, more on the right knee vs the left, onset months ago. He notes that he had an injury about 15 years ago where he tore his ACL and never gotten repair. Pt states that the pain is very severe that it keeps him up at night. Pt states  his pain is less severe when he is moving around. He also indicates that going downstairs execrates the pain. Pt notes that his last gout flare was four days ago. He reports that he plans to have a surgery at [facility] in November. He has had MRIs of knees and those his current level of dysfunction but is having trouble maintaining his activity at work. He does get better after he starts walking and gets warmed up  Pt notes that his diabetes is controlled through diet. He has a family history of diabetes (Father). C complete physical January 2016  Patient Active Problem List   Diagnosis Date Noted  . Gout attack 04/10/2014  . Calcium pyrophosphate arthropathy 02/01/2014  . Diabetes 01/31/2014  . Unspecified essential hypertension 01/31/2014  . Hypertriglyceridemia 01/31/2014     Review of Systems  Musculoskeletal: Positive for arthralgias and gait problem.       Objective:   Physical Exam  Constitutional: He is oriented to person, place, and time. He appears well-developed and well-nourished. No distress.  HENT:  Head: Normocephalic and atraumatic.  Eyes: EOM are  normal. Pupils are equal, round, and reactive to light.  Neck: Neck supple.  Cardiovascular: Normal rate.   Pulmonary/Chest: Effort normal.  Musculoskeletal: He exhibits no edema.  Neither knee has an effusion. There is no redness. The right knee has pain with full extension or flexion past 90 but no instability to ligament stress. There is crepitus under the patella with popping. The left knee has a fair range of motion with less discomfort.  Neurological: He is alert and oriented to person, place, and time. No cranial nerve deficit.  Skin: Skin is warm and dry.  Psychiatric: He has a normal mood and affect. His behavior is normal.  Nursing note and vitals reviewed.  BP 98/70 mmHg  Pulse 103  Temp(Src) 98.3 F (36.8 C) (Oral)  Resp 17  Ht 5\' 8"  (1.727 m)  Wt 210 lb 6.4 oz (95.437 kg)  BMI 32.00 kg/m2  SpO2 99%    Assessment & Plan:  Problem #1 osteoarthritis both knees with absent anterior cruciate ligament on the left and known cartilage damage on the right  Problem #2 history of gout  Plan--start meloxicam/recheck creatinine in one month in view of creatinine January 2016/if not responding then sterilely injection next trying to keep him active until planned surgery in November -Refill cultures seen so he has meds for any gout flare///started antigout.diet  I have completed the patient encounter in its entirety as documented by the scribe, with  editing by me where necessary. Chase Higgins P. Laney Pastor, M.D.

## 2015-06-04 ENCOUNTER — Telehealth: Payer: Self-pay

## 2015-06-04 MED ORDER — COLCHICINE 0.6 MG PO TABS: 0.6000 mg | ORAL_TABLET | Freq: Every day | ORAL | Status: DC

## 2015-06-04 MED ORDER — TRAMADOL HCL 50 MG PO TABS: 50.0000 mg | ORAL_TABLET | Freq: Three times a day (TID) | ORAL | Status: DC | PRN

## 2015-06-04 NOTE — Telephone Encounter (Signed)
Patient is calling because the arthritis medication he was prescribed is not working. Please advise! [phone]

## 2015-06-04 NOTE — Telephone Encounter (Signed)
Meloxicam not working  Creatinine 1.37 anyway Refer back to orthopedics for steroidal injections May use tramadol temporarily

## 2015-06-04 NOTE — Telephone Encounter (Signed)
Any other suggestions.for medication?

## 2015-06-04 NOTE — Telephone Encounter (Signed)
PA needed for colchicine. Called pharm to verify needed bc often ins prefers Brand name Mitigare or Colcrys. Pharm reported pt paid cash and it was about $46. He stated it was a little expensive. Had pharm try the [facility] and it was preferred and covered for $40. I had her DC the RF on generic colchicine and will send in another script for Mitigare to lower cost a little.

## 2015-06-05 NOTE — Telephone Encounter (Signed)
Faxed Rx and notified pt. He stated that he already has info at ortho and doesn't need a new referral, but agrees to go ahead and set up appt w/ortho. Pt stated that he is not sure that tramadol will work for his pain, and has used hydrocodone in the past w/ effectiveness, but he has not tried the tramadol before. I advised pt to try the tramadol today and to call me back later if not effective and I will ask Dr Laney Pastor about the hydrocodone. Pt agreed.

## 2015-07-03 ENCOUNTER — Ambulatory Visit: Payer: 59 | Admitting: Family Medicine

## 2015-07-16 ENCOUNTER — Ambulatory Visit: Payer: 59 | Admitting: Family Medicine

## 2015-07-23 ENCOUNTER — Telehealth: Payer: Self-pay | Admitting: *Deleted

## 2015-07-23 ENCOUNTER — Ambulatory Visit: Payer: 59 | Admitting: Family Medicine

## 2015-07-23 NOTE — Telephone Encounter (Signed)
Patient was a "No Show' for his appointment, his cell phone mail box was full. I was unable to reach him, according to note the in EPIC he has rescheduled 2 times.

## 2015-11-05 ENCOUNTER — Telehealth: Payer: Self-pay

## 2015-11-05 NOTE — Telephone Encounter (Signed)
He would need OV correct?

## 2015-11-05 NOTE — Telephone Encounter (Signed)
The patient left a message requesting a referral for an x-ray to test for TB.  He said his TB skin test came up positive, so he needs an x-ray done.  He said he needs a referral for a specialist in [place], [place].  I believe he would need an order for an x-ray, not a referral.  Please advise, thank you.  CB#: [phone]

## 2015-11-05 NOTE — Telephone Encounter (Signed)
He needs to be seen.  Philis Fendt, MS, PA-C 9:09 PM, 11/05/2015

## 2015-11-06 NOTE — Telephone Encounter (Signed)
Pt notified and received message.

## 2016-01-25 ENCOUNTER — Encounter (HOSPITAL_COMMUNITY): Payer: Self-pay | Admitting: Emergency Medicine

## 2016-01-25 ENCOUNTER — Emergency Department (HOSPITAL_COMMUNITY)
Admission: EM | Admit: 2016-01-25 | Discharge: 2016-01-25 | Disposition: A | Discharge status: Home / Self Care | Payer: BLUE CROSS/BLUE SHIELD | Attending: Emergency Medicine | Admitting: Emergency Medicine

## 2016-01-25 DIAGNOSIS — M109 Gout, unspecified: Secondary | ICD-10-CM

## 2016-01-25 DIAGNOSIS — E119 Type 2 diabetes mellitus without complications: Secondary | ICD-10-CM | POA: Insufficient documentation

## 2016-01-25 DIAGNOSIS — M10061 Idiopathic gout, right knee: Secondary | ICD-10-CM | POA: Diagnosis not present

## 2016-01-25 DIAGNOSIS — F17210 Nicotine dependence, cigarettes, uncomplicated: Secondary | ICD-10-CM | POA: Insufficient documentation

## 2016-01-25 DIAGNOSIS — M25561 Pain in right knee: Secondary | ICD-10-CM | POA: Diagnosis present

## 2016-01-25 LAB — CBG MONITORING, ED: Glucose-Capillary: 142 mg/dL — ABNORMAL HIGH (ref 65–99)

## 2016-01-25 MED ORDER — OXYCODONE-ACETAMINOPHEN 5-325 MG PO TABS
1.0000 | ORAL_TABLET | Freq: Once | ORAL | Status: AC
  Administered 2016-01-25: 1 via ORAL
  Filled 2016-01-25: qty 1

## 2016-01-25 MED ORDER — KETOROLAC TROMETHAMINE 60 MG/2ML IM SOLN
60.0000 mg | Freq: Once | INTRAMUSCULAR | Status: DC
  Filled 2016-01-25: qty 2

## 2016-01-25 MED ORDER — OXYCODONE-ACETAMINOPHEN 5-325 MG PO TABS: 1.0000 | ORAL_TABLET | Freq: Once | ORAL | Status: DC

## 2016-01-25 MED ORDER — COLCHICINE 0.6 MG PO TABS: 0.6000 mg | ORAL_TABLET | Freq: Two times a day (BID) | ORAL | Status: DC

## 2016-01-25 MED ORDER — DEXAMETHASONE SODIUM PHOSPHATE 10 MG/ML IJ SOLN
10.0000 mg | Freq: Once | INTRAMUSCULAR | Status: AC
  Administered 2016-01-25: 10 mg via INTRAMUSCULAR
  Filled 2016-01-25: qty 1

## 2016-01-25 MED ORDER — IBUPROFEN 800 MG PO TABS: 800.0000 mg | ORAL_TABLET | Freq: Three times a day (TID) | ORAL | Status: DC

## 2016-01-25 MED ORDER — OXYCODONE-ACETAMINOPHEN 5-325 MG PO TABS: 1.0000 | ORAL_TABLET | ORAL | Status: DC | PRN

## 2016-01-25 MED ORDER — OXYCODONE-ACETAMINOPHEN 5-325 MG PO TABS
1.0000 | ORAL_TABLET | Freq: Once | ORAL | Status: AC
Start: 2016-01-25 — End: 2016-01-25
  Administered 2016-01-25: 1 via ORAL
  Filled 2016-01-25: qty 1

## 2016-01-25 NOTE — ED Provider Notes (Signed)
CSN: [id number]     Arrival date & time 01/25/16  0126 History   By signing my name below, I, Chase Higgins, attest that this documentation has been prepared under the direction and in the presence of Chase Greek, MD.   Electronically Signed: Nicole Higgins, ED Scribe. 01/25/2016. 3:52 AM     No chief complaint on file.   The history is provided by the patient. No language interpreter was used.   HPI Comments: Chase Higgins is a 45 y.o. male who presents to the Emergency Department complaining of gout in his right knee, onset earlier tonight. Pt can move knee with some difficulty. The pain is worsened with movement. No other worsening or alleviating factors noted. Pt denies fever, or any other pertinent symptoms.   Swelling right knee slight warmth no effusion no erythema  Past Medical History  Diagnosis Date  . Arthritis   . Diabetes mellitus without complication ([place])    History reviewed. No pertinent past surgical history. Family History  Problem Relation Age of Onset  . Hypertension Mother   . Hyperlipidemia Mother   . Diabetes Father   . Stroke Father    Social History  Substance Use Topics  . Smoking status: Current Every Day Smoker -- 0.50 packs/day for 21 years    Types: Cigarettes  . Smokeless tobacco: Never Used  . Alcohol Use: 1.2 oz/week    2 Standard drinks or equivalent per week    Review of Systems  Musculoskeletal: Positive for arthralgias.  All other systems reviewed and are negative.     Allergies  Review of patient's allergies indicates no known allergies.  Home Medications   Prior to Admission medications   Medication Sig Start Date End Date Taking? Authorizing Provider  colchicine 0.6 MG tablet Take 1 tablet (0.6 mg total) by mouth daily. One twice a day til gout resolves then once a day for 3 more days Patient not taking: Reported on 01/25/2016 06/04/15   Leandrew Koyanagi, MD  fenofibrate (TRICOR) 145 MG tablet TAKE 1 TABLET  BY MOUTH EVERY DAY. Patient not taking: Reported on 06/01/2015 01/03/15   Elby Beck, FNP  ibuprofen (ADVIL,MOTRIN) 800 MG tablet Take 1 tablet (800 mg total) by mouth 3 (three) times daily. 01/25/16   Chase Greek, MD  oxyCODONE-acetaminophen (PERCOCET) 5-325 MG tablet Take 1-2 tablets by mouth every 4 (four) hours as needed. 01/25/16   Chase Greek, MD   BP 110/79 mmHg  Pulse 77  Temp(Src) 97.9 F (36.6 C) (Oral)  Resp 18  SpO2 96% Physical Exam  Constitutional: He is oriented to person, place, and time. He appears well-developed and well-nourished. No distress.  HENT:  Head: Normocephalic and atraumatic.  Right Ear: Hearing normal.  Left Ear: Hearing normal.  Nose: Nose normal.  Mouth/Throat: Oropharynx is clear and moist and mucous membranes are normal.  Eyes: Conjunctivae and EOM are normal. Pupils are equal, round, and reactive to light.  Neck: Normal range of motion. Neck supple.  Cardiovascular: Regular rhythm, S1 normal and S2 normal.  Exam reveals no gallop and no friction rub.   No murmur heard. Pulmonary/Chest: Effort normal and breath sounds normal. No respiratory distress. He exhibits no tenderness.  Abdominal: Soft. Normal appearance and bowel sounds are normal. There is no hepatosplenomegaly. There is no tenderness. There is no rebound, no guarding, no tenderness at McBurney's point and negative Murphy's sign. No hernia.  Musculoskeletal: Normal range of motion.       Right  knee: He exhibits swelling. He exhibits no effusion and no erythema.  Swelling to right knee with slight warmth. No effusion.  No erythema.   Neurological: He is alert and oriented to person, place, and time. He has normal strength. No cranial nerve deficit or sensory deficit. Coordination normal. GCS eye subscore is 4. GCS verbal subscore is 5. GCS motor subscore is 6.  Skin: Skin is warm, dry and intact. No rash noted. No cyanosis.  Psychiatric: He has a normal mood and  affect. His speech is normal and behavior is normal. Thought content normal.  Nursing note and vitals reviewed.   ED Course  Procedures (including critical care time) DIAGNOSTIC STUDIES: Oxygen Saturation is 96% on RA, adequate by my interpretation.    COORDINATION OF CARE:  3:49 AM-Discussed treatment plan which includes oxycodone-acetaminophen  with pt at bedside and pt agreed to plan.   Labs Review Labs Reviewed  CBG MONITORING, ED - Abnormal; Notable for the following:    Glucose-Capillary 142 (*)    All other components within normal limits    Imaging Review No results found.    EKG Interpretation None      MDM   Final diagnoses:  Acute gout of right knee, unspecified cause   Presents to the ER for evaluation of pain in his right knee for 1 day. Patient reports previous history of gout with similar symptoms. Examination reveals mild swelling of the knee without any obvious effusion. There is slight warmth but no erythema. This does not appear consistent with a septic arthritis. Patient reports previous history of gout in this knee with identical symptoms. Patient will be treated empirically for gout. He was given IM Decadron. He does report a history of diabetes, however blood sugar was 142 today.  I personally performed the services described in this documentation, which was scribed in my presence. The recorded information has been reviewed and is accurate.      Chase Greek, MD 01/25/16 [phone]

## 2016-01-25 NOTE — ED Notes (Signed)
Patient d/c'd self care.  Patients friend was called to pick him up.  F/U and medications discussed.  Patient verbalized understanding.

## 2016-01-25 NOTE — Discharge Instructions (Signed)

## 2016-01-25 NOTE — ED Notes (Signed)
Patient c/o gout flare up in left knee x1 day. History of same.

## 2016-01-27 ENCOUNTER — Encounter (HOSPITAL_COMMUNITY): Payer: Self-pay | Admitting: *Deleted

## 2016-01-27 ENCOUNTER — Emergency Department (HOSPITAL_COMMUNITY)
Admission: EM | Admit: 2016-01-27 | Discharge: 2016-01-27 | Disposition: A | Discharge status: Home / Self Care | Payer: BLUE CROSS/BLUE SHIELD | Source: Home / Self Care | Attending: Family Medicine | Admitting: Family Medicine

## 2016-01-27 DIAGNOSIS — M1000 Idiopathic gout, unspecified site: Secondary | ICD-10-CM

## 2016-01-27 HISTORY — DX: Gout, unspecified: M10.9

## 2016-01-27 MED ORDER — INDOMETHACIN 50 MG PO CAPS: 50.0000 mg | ORAL_CAPSULE | Freq: Three times a day (TID) | ORAL | Status: DC

## 2016-01-27 NOTE — Discharge Instructions (Signed)
See orthopedist for further care.

## 2016-01-27 NOTE — ED Provider Notes (Signed)
CSN: [id number]     Arrival date & time 01/27/16  1558 History   First MD Initiated Contact with Patient 01/27/16 1758     Chief Complaint  Patient presents with  . Gout   (Consider location/radiation/quality/duration/timing/severity/associated sxs/prior Treatment) Patient is a 45 y.o. male presenting with knee pain. The history is provided by the patient.  Knee Pain Location:  Knee Time since incident:  3 days Injury: no   Knee location:  R knee Pain details:    Quality:  Sharp   Radiates to:  Does not radiate   Severity:  Moderate   Onset quality:  Sudden   Progression:  Worsening Chronicity:  Recurrent (pt with gout in knee, c/o effusion and need for pain med., given per 5 in ER) Dislocation: no   Prior injury to area:  No   Past Medical History  Diagnosis Date  . Arthritis   . Diabetes mellitus without complication ([place])    No past surgical history on file. Family History  Problem Relation Age of Onset  . Hypertension Mother   . Hyperlipidemia Mother   . Diabetes Father   . Stroke Father    Social History  Substance Use Topics  . Smoking status: Current Every Day Smoker -- 0.50 packs/day for 21 years    Types: Cigarettes  . Smokeless tobacco: Never Used  . Alcohol Use: 1.2 oz/week    2 Standard drinks or equivalent per week    Review of Systems  Genitourinary: Negative.   Musculoskeletal: Positive for joint swelling and gait problem.  Skin: Negative.   All other systems reviewed and are negative.   Allergies  Review of patient's allergies indicates no known allergies.  Home Medications   Prior to Admission medications   Medication Sig Start Date End Date Taking? Authorizing Provider  colchicine 0.6 MG tablet Take 1 tablet (0.6 mg total) by mouth 2 (two) times daily. 01/25/16  Yes Orpah Greek, MD  ibuprofen (ADVIL,MOTRIN) 800 MG tablet Take 1 tablet (800 mg total) by mouth 3 (three) times daily. 01/25/16  Yes Orpah Greek, MD   oxyCODONE-acetaminophen (PERCOCET) 5-325 MG tablet Take 1-2 tablets by mouth every 4 (four) hours as needed. 01/25/16  Yes Orpah Greek, MD  fenofibrate (TRICOR) 145 MG tablet TAKE 1 TABLET BY MOUTH EVERY DAY. Patient not taking: Reported on 06/01/2015 01/03/15   Elby Beck, FNP   Meds Ordered and Administered this Visit  Medications - No data to display  BP 118/86 mmHg  Pulse 83  Temp(Src) 99.1 F (37.3 C) (Oral)  Resp 16  SpO2 100% No data found.   Physical Exam  Constitutional: He is oriented to person, place, and time. He appears well-developed and well-nourished.  Musculoskeletal: He exhibits tenderness.       Right knee: He exhibits decreased range of motion, swelling and effusion. He exhibits no ecchymosis, no erythema, normal alignment and normal patellar mobility.  Neurological: He is alert and oriented to person, place, and time.  Skin: Skin is warm and dry.  Nursing note and vitals reviewed.   ED Course  Procedures (including critical care time)  Labs Review Labs Reviewed - No data to display  Imaging Review No results found.   Visual Acuity Review  Right Eye Distance:   Left Eye Distance:   Bilateral Distance:    Right Eye Near:   Left Eye Near:    Bilateral Near:         MDM  No diagnosis found.  Meds ordered this encounter  Medications  . indomethacin (INDOCIN) 50 MG capsule    Sig: Take 1 capsule (50 mg total) by mouth 3 (three) times daily with meals.    Dispense:  20 capsule    Refill:  2       Billy Fischer, MD 01/27/16 1807

## 2016-01-27 NOTE — ED Notes (Addendum)
C/O significant right knee gout pain and lesser pain in right great toe since 2/16.  Has been taking cochicine.  C/O nausea due to severe pain.  Pt states he was dropped off.  After reviewing chart, asked pt about meds prescribed 2 days ago in [facility] ED - states he ran out of the Percocet.

## 2016-01-28 ENCOUNTER — Telehealth: Payer: Self-pay

## 2016-07-28 ENCOUNTER — Ambulatory Visit (INDEPENDENT_AMBULATORY_CARE_PROVIDER_SITE_OTHER): Payer: BLUE CROSS/BLUE SHIELD | Admitting: Physician Assistant

## 2016-07-28 ENCOUNTER — Encounter: Payer: Self-pay | Admitting: Physician Assistant

## 2016-07-28 ENCOUNTER — Ambulatory Visit (INDEPENDENT_AMBULATORY_CARE_PROVIDER_SITE_OTHER): Payer: BLUE CROSS/BLUE SHIELD

## 2016-07-28 VITALS — BP 132/82 | HR 100 | Temp 98.2°F | Resp 17 | Ht 67.5 in | Wt 212.0 lb

## 2016-07-28 DIAGNOSIS — M79671 Pain in right foot: Secondary | ICD-10-CM

## 2016-07-28 DIAGNOSIS — M25561 Pain in right knee: Secondary | ICD-10-CM

## 2016-07-28 DIAGNOSIS — M10061 Idiopathic gout, right knee: Secondary | ICD-10-CM | POA: Diagnosis not present

## 2016-07-28 DIAGNOSIS — M109 Gout, unspecified: Secondary | ICD-10-CM

## 2016-07-28 MED ORDER — COLCHICINE 0.6 MG PO TABS: ORAL_TABLET | ORAL | 1 refills | Status: DC

## 2016-07-28 MED ORDER — COLCHICINE 0.6 MG PO TABS: ORAL_TABLET | ORAL | 0 refills | Status: DC

## 2016-07-28 MED ORDER — HYDROCODONE-ACETAMINOPHEN 10-325 MG PO TABS: 1.0000 | ORAL_TABLET | Freq: Three times a day (TID) | ORAL | 0 refills | Status: DC | PRN

## 2016-07-28 NOTE — Progress Notes (Addendum)
By signing my name below, I, Chase Higgins, attest that this documentation has been prepared under the direction and in the presence of Treatment Team:  Attending Provider: Wardell Honour, MD Physician Assistant: Joretta Bachelor, [place].  Electronically Signed: Verlee Monte, Medical Scribe. 07/28/16. 10:05 AM.  Subjective:    Patient ID: Chase Higgins, male    DOB: 10-01-1971, 45 y.o.   MRN: [id number]  HPI Chief Complaint  Patient presents with   Other    ankle knee foot pain from gout per patient     HPI Comments: Chase Higgins is a 45 y.o. male who presents to the [facility] complaining of severe gout pain in his right knee, right foot and right ankle onset 3 days ago. Pt reports sleeping for 1 hour at a time, and some swelling in his foot and ankle. Pt has been eating a lot of steak, and he drinks 40 oz about 5-6 days a week. Pt mentions he's had gout in both legs in the past that got so bad he was wheel chair bound at one point. Pt mentions he's always had a nodule on his knee. 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 Pt took 6 Advil for little to no relief to his symptoms. Pt denies recent injury, warmth, numbness, tingling, calf pain, and SOB.  Patient Active Problem List   Diagnosis Date Noted   Gout attack 04/10/2014   Calcium pyrophosphate arthropathy 02/01/2014   Diabetes ([place]) 01/31/2014   Unspecified essential hypertension 01/31/2014   Hypertriglyceridemia 01/31/2014   Past Medical History:  Diagnosis Date   Arthritis    Diabetes mellitus without complication ([place])    Gout    No past surgical history on file. No Known Allergies Prior to Admission medications   Medication Sig Start Date End Date Taking? Authorizing Provider  colchicine 0.6 MG tablet Take 1 tablet (0.6 mg total) by mouth 2 (two) times daily. Patient not taking: Reported on 07/28/2016 01/25/16   Orpah Greek, MD    fenofibrate (TRICOR) 145 MG tablet TAKE 1 TABLET BY MOUTH EVERY DAY. Patient not taking: Reported on 06/01/2015 01/03/15   Elby Beck, FNP  ibuprofen (ADVIL,MOTRIN) 800 MG tablet Take 1 tablet (800 mg total) by mouth 3 (three) times daily. Patient not taking: Reported on 07/28/2016 01/25/16   Orpah Greek, MD  indomethacin (INDOCIN) 50 MG capsule Take 1 capsule (50 mg total) by mouth 3 (three) times daily with meals. Patient not taking: Reported on 07/28/2016 01/27/16   Billy Fischer, MD  oxyCODONE-acetaminophen (PERCOCET) 5-325 MG tablet Take 1-2 tablets by mouth every 4 (four) hours as needed. Patient not taking: Reported on 07/28/2016 01/25/16   Orpah Greek, MD   Social History   Social History   Marital status: Married    Spouse name: N/A   Number of children: N/A   Years of education: 12   Occupational History   Not on file.   Social History Main Topics   Smoking status: Current Every Day Smoker    Packs/day: 0.50    Years: 29.00    Types: Cigarettes   Smokeless tobacco: Never Used   Alcohol use Not on file   Drug use:     Types: Cocaine, Marijuana     Comment: Hx per record   Sexual activity: No   Other Topics Concern   Not on file   Social History Narrative  No narrative on file   Depression screen [facility] 2/9 07/28/2016 06/01/2015  Decreased Interest 0 0  Down, Depressed, Hopeless 0 0  PHQ - 2 Score 0 0   Review of Systems  Respiratory: Negative for shortness of breath.   Musculoskeletal: Negative for myalgias (calf pain).  Neurological: Negative for numbness.       Neg tingling   Objective: BP 132/82 (BP Location: Right Arm, Patient Position: Sitting, Cuff Size: Normal)    Pulse 100    Temp 98.2 F (36.8 C) (Oral)    Resp 17    Ht 5' 7.5" (1.715 m)    Wt 212 lb (96.2 kg)    SpO2 97%    BMI 32.71 kg/m   Physical Exam  Constitutional: He appears well-developed and well-nourished. No distress.  HENT:  Head: Normocephalic and  atraumatic.  Eyes: Conjunctivae are normal.  Neck: Neck supple.  Cardiovascular: Normal rate.   Pulmonary/Chest: Effort normal. No respiratory distress. He has no wheezes.  Musculoskeletal:       Right knee: He exhibits no ecchymosis. Tenderness found. Medial joint line and lateral joint line tenderness noted. No MCL, no LCL and no patellar tendon tenderness noted.       Right lower leg: He exhibits bony tenderness.  Metatarsal swelling at the dorsal aspect with mild ecchymosis Knee pain with active and passive flexion Dorsal flexion and plantar flexion with diminished flexion on all planes Talonavicular pain No calf tenderness.  Neurological: He is alert.  Skin: Skin is warm and dry.  Psychiatric: He has a normal mood and affect. His behavior is normal.  Nursing note and vitals reviewed.  Dg Knee Complete 4 Views Right  Result Date: 07/28/2016 CLINICAL DATA:  Gout.  Pain.  Initial evaluation EXAM: RIGHT KNEE - COMPLETE 4+ VIEW COMPARISON:  01/31/2014. FINDINGS: Knee joint effusion is noted. Mild tricompartment degenerative change. No evidence all fracture or dislocation . IMPRESSION: 1. Knee joint effusion. 2. Mild tricompartment degenerative change . No acute or erosive bony abnormality. Electronically Signed   By: Marcello Moores  Register   On: 07/28/2016 10:46   Dg Foot Complete Right  Result Date: 07/28/2016 CLINICAL DATA:  Pain. EXAM: RIGHT FOOT COMPLETE - 3+ VIEW COMPARISON:  No recent prior . FINDINGS: Prominent degenerative changes first MTP joint. No evidence fracture dislocation. IMPRESSION: Prominent degenerative changes first MTP joint. No acute abnormality. Electronically Signed   By: Marcello Moores  Register   On: 07/28/2016 10:43   Assessment & Plan:  45 year old male is here today for cc of right ankle, knee, and foot pain. This is possibly gout.  I have encouraged him to lower if not completely stop the alcohol intake.   If this continues, we should proceed with sed rate and possibly  inflammatory (rheumatoid) flare.   rtc as needed.   Acute gout of right knee, unspecified cause - Plan: HYDROcodone-acetaminophen (NORCO) 10-325 MG tablet, colchicine 0.6 MG tablet, DISCONTINUED: colchicine 0.6 MG tablet  Right knee pain - Plan: DG Knee Complete 4 Views Right, HYDROcodone-acetaminophen (NORCO) 10-325 MG tablet, colchicine 0.6 MG tablet, DISCONTINUED: colchicine 0.6 MG tablet  Right foot pain - Plan: DG Foot Complete Right, HYDROcodone-acetaminophen (NORCO) 10-325 MG tablet, colchicine 0.6 MG tablet, DISCONTINUED: colchicine 0.6 MG tablet  Ivar Drape, PA-C [facility] 8/21/201711:13 AM   I personally performed the services described in this documentation, which was scribed in my presence. The recorded information has been reviewed and is accurate.

## 2016-07-28 NOTE — Patient Instructions (Addendum)
You really need to stop your alcohol intake.  This may be the culprit of your gouty flares.    Gout Gout is an inflammatory arthritis caused by a buildup of uric acid crystals in the joints. Uric acid is a chemical that is normally present in the blood. When the level of uric acid in the blood is too high it can form crystals that deposit in your joints and tissues. This causes joint redness, soreness, and swelling (inflammation). Repeat attacks are common. Over time, uric acid crystals can form into masses (tophi) near a joint, destroying bone and causing disfigurement. Gout is treatable and often preventable. CAUSES  The disease begins with elevated levels of uric acid in the blood. Uric acid is produced by your body when it breaks down a naturally found substance called purines. Certain foods you eat, such as meats and fish, contain high amounts of purines. Causes of an elevated uric acid level include:  Being passed down from parent to child (heredity).  Diseases that cause increased uric acid production (such as obesity, psoriasis, and certain cancers).  Excessive alcohol use.  Diet, especially diets rich in meat and seafood.  Medicines, including certain cancer-fighting medicines (chemotherapy), water pills (diuretics), and aspirin.  Chronic kidney disease. The kidneys are no longer able to remove uric acid well.  Problems with metabolism. Conditions strongly associated with gout include:  Obesity.  High blood pressure.  High cholesterol.  Diabetes. Not everyone with elevated uric acid levels gets gout. It is not understood why some people get gout and others do not. Surgery, joint injury, and eating too much of certain foods are some of the factors that can lead to gout attacks. SYMPTOMS   An attack of gout comes on quickly. It causes intense pain with redness, swelling, and warmth in a joint.  Fever can occur.  Often, only one joint is involved. Certain joints are more  commonly involved:  Base of the big toe.  Knee.  Ankle.  Wrist.  Finger. Without treatment, an attack usually goes away in a few days to weeks. Between attacks, you usually will not have symptoms, which is different from many other forms of arthritis. DIAGNOSIS  Your caregiver will suspect gout based on your symptoms and exam. In some cases, tests may be recommended. The tests may include:  Blood tests.  Urine tests.  X-rays.  Joint fluid exam. This exam requires a needle to remove fluid from the joint (arthrocentesis). Using a microscope, gout is confirmed when uric acid crystals are seen in the joint fluid. TREATMENT  There are two phases to gout treatment: treating the sudden onset (acute) attack and preventing attacks (prophylaxis).  Treatment of an Acute Attack.  Medicines are used. These include anti-inflammatory medicines or steroid medicines.  An injection of steroid medicine into the affected joint is sometimes necessary.  The painful joint is rested. Movement can worsen the arthritis.  You may use warm or cold treatments on painful joints, depending which works best for you.  Treatment to Prevent Attacks.  If you suffer from frequent gout attacks, your caregiver may advise preventive medicine. These medicines are started after the acute attack subsides. These medicines either help your kidneys eliminate uric acid from your body or decrease your uric acid production. You may need to stay on these medicines for a very long time.  The early phase of treatment with preventive medicine can be associated with an increase in acute gout attacks. For this reason, during the first few  months of treatment, your caregiver may also advise you to take medicines usually used for acute gout treatment. Be sure you understand your caregiver's directions. Your caregiver may make several adjustments to your medicine dose before these medicines are effective.  Discuss dietary treatment  with your caregiver or dietitian. Alcohol and drinks high in sugar and fructose and foods such as meat, poultry, and seafood can increase uric acid levels. Your caregiver or dietitian can advise you on drinks and foods that should be limited. HOME CARE INSTRUCTIONS   Do not take aspirin to relieve pain. This raises uric acid levels.  Only take over-the-counter or prescription medicines for pain, discomfort, or fever as directed by your caregiver.  Rest the joint as much as possible. When in bed, keep sheets and blankets off painful areas.  Keep the affected joint raised (elevated).  Apply warm or cold treatments to painful joints. Use of warm or cold treatments depends on which works best for you.  Use crutches if the painful joint is in your leg.  Drink enough fluids to keep your urine clear or pale yellow. This helps your body get rid of uric acid. Limit alcohol, sugary drinks, and fructose drinks.  Follow your dietary instructions. Pay careful attention to the amount of protein you eat. Your daily diet should emphasize fruits, vegetables, whole grains, and fat-free or low-fat milk products. Discuss the use of coffee, vitamin C, and cherries with your caregiver or dietitian. These may be helpful in lowering uric acid levels.  Maintain a healthy body weight. SEEK MEDICAL CARE IF:   You develop diarrhea, vomiting, or any side effects from medicines.  You do not feel better in 24 hours, or you are getting worse. SEEK IMMEDIATE MEDICAL CARE IF:   Your joint becomes suddenly more tender, and you have chills or a fever. MAKE SURE YOU:   Understand these instructions.  Will watch your condition.  Will get help right away if you are not doing well or get worse.   This information is not intended to replace advice given to you by your health care provider. Make sure you discuss any questions you have with your health care provider.   Document Released: 11/21/2000 Document Revised:  12/15/2014 Document Reviewed: 07/07/2012 Elsevier Interactive Patient Education 2016 [employer].    IF you received an x-ray today, you will receive an invoice from [facility]. Please contact [facility] at [phone] with questions or concerns regarding your invoice.   IF you received labwork today, you will receive an invoice from [employer]. Please contact [facility] at [phone] with questions or concerns regarding your invoice.   Our billing staff will not be able to assist you with questions regarding bills from these companies.  You will be contacted with the lab results as soon as they are available. The fastest way to get your results is to activate your My Chart account. Instructions are located on the last page of this paperwork. If you have not heard from [place] regarding the results in 2 weeks, please contact this office.

## 2016-07-30 ENCOUNTER — Telehealth: Payer: Self-pay

## 2016-07-30 ENCOUNTER — Encounter: Payer: Self-pay | Admitting: Family Medicine

## 2016-07-30 ENCOUNTER — Ambulatory Visit (INDEPENDENT_AMBULATORY_CARE_PROVIDER_SITE_OTHER): Payer: BLUE CROSS/BLUE SHIELD | Admitting: Family Medicine

## 2016-07-30 VITALS — BP 112/72 | HR 102 | Temp 98.5°F | Resp 17 | Ht 67.5 in | Wt 205.0 lb

## 2016-07-30 DIAGNOSIS — M25561 Pain in right knee: Secondary | ICD-10-CM

## 2016-07-30 DIAGNOSIS — M109 Gout, unspecified: Secondary | ICD-10-CM

## 2016-07-30 DIAGNOSIS — M10061 Idiopathic gout, right knee: Secondary | ICD-10-CM

## 2016-07-30 MED ORDER — HYDROCODONE-ACETAMINOPHEN 10-325 MG PO TABS: 1.0000 | ORAL_TABLET | Freq: Three times a day (TID) | ORAL | 0 refills | Status: DC | PRN

## 2016-07-30 NOTE — Addendum Note (Signed)
Addended by: Burnis Kingfisher on: 07/30/2016 11:29 AM   Modules accepted: Orders

## 2016-07-30 NOTE — Telephone Encounter (Signed)
Patient request for a refill of Hydrocodone 10.325 MG. [phone].

## 2016-07-30 NOTE — Patient Instructions (Addendum)
We removed 45 cc's of fluid from your knee.  You should feel much better after this.  I have refilled your pain medicaitons for the next several days.  The fluid removal will mean your pain is much better.  If you have redness, swelling, or fevers, come back immediately.  It was good to see you today     IF you received an x-ray today, you will receive an invoice from [facility]. Please contact [facility] at [phone] with questions or concerns regarding your invoice.   IF you received labwork today, you will receive an invoice from [employer]. Please contact [facility] at [phone] with questions or concerns regarding your invoice.   Our billing staff will not be able to assist you with questions regarding bills from these companies.  You will be contacted with the lab results as soon as they are available. The fastest way to get your results is to activate your My Chart account. Instructions are located on the last page of this paperwork. If you have not heard from [place] regarding the results in 2 weeks, please contact this office.

## 2016-07-30 NOTE — Addendum Note (Signed)
Addended by: Burnis Kingfisher on: 07/30/2016 11:36 AM   Modules accepted: Orders

## 2016-07-30 NOTE — Progress Notes (Signed)
Chase Higgins is a 45 y.o. male who presents to Urgent Care today for Right knee pain:  1.  Right knee pain:  Diagnosed with gout 2 days ago.  Has history of the same, averages 2-3 attacks per year.  Treated with colchicine and Norco last visit.  Still has some of the colchicine as he was provided a refill.  Out of the Norco.  Knee pain and effusion have persisted.  Would like knee aspiration to remove fluid so he can return back to work.  Redness and heat around knee have resolved.  Difficulty ambulating due to pain. No fevers or chills.   ROS as above.    PMH reviewed. Patient is a nonsmoker.   Past Medical History:  Diagnosis Date  . Arthritis   . Diabetes mellitus without complication ([place])   . Gout    No past surgical history on file.  Medications reviewed. Current Outpatient Prescriptions  Medication Sig Dispense Refill  . colchicine 0.6 MG tablet Take 2 tablets by mouth, after an hour take 1 tablet. 3 tablet 1  . HYDROcodone-acetaminophen (NORCO) 10-325 MG tablet Take 1 tablet by mouth every 8 (eight) hours as needed. 10 tablet 0  . indomethacin (INDOCIN) 50 MG capsule Take 1 capsule (50 mg total) by mouth 3 (three) times daily with meals. (Patient not taking: Reported on 07/28/2016) 20 capsule 2   No current facility-administered medications for this visit.      Physical Exam:  BP 112/72 (BP Location: Right Arm, Patient Position: Sitting, Cuff Size: Normal)   Pulse (!) 102   Temp 98.5 F (36.9 C) (Oral)   Resp 17   Ht 5' 7.5" (1.715 m)   Wt 205 lb (93 kg)   SpO2 98%   BMI 31.63 kg/m  Gen:  Alert, cooperative patient who appears stated age in no acute distress.  Vital signs reviewed. HEENT: EOMI,  MMM Pulm:  Clear to auscultation bilaterally with good air movement.  No wheezes or rales noted.   Cardiac:  Regular rate and rhythm without murmur auscultated.  Good S1/S2. MSK:  Right knee:  2+ effusion in Right knee. No Redness, rash, wounds, skin changes or warmth.  Patella medial joint line non tender.No joint line tenderness. The fibular head is nontender. No valgus/varus testing.  Does have pain with attempts to flex knee beyond 90 degrees.  Knee aspiration:  Risks and benefits discuss.  Consent obtained.  Time out performed.  Area cleaned with betadine x 3.  Followed by alcohol swab.  Using sterile technique the Right knee was prepped and 2 ml's of 1% plain Lidocaine used to anesthetize the needle tract into the joint from the lateral suprapatellar approach. The knee joint was entered with 21 gauge 1.5 inch needle and 45 ccs of yellow/straw-colored fluid was withdrawn and sent for synovial analysis and culture.  No injection performed.  The procedure was well tolerated.  No bleeding.  Bandage placed.    Assessment and Plan:  1.  Right knee effusion - s/p gout - refilled short-term course of hydrocodone.  Continue colchicine.   - The patient is asked to continue to rest the knee for a few more days before resuming regular activities.  It may be more painful for the first 1-2 days.  Watch for fever, or increased swelling or persistent pain in knee. Call or return to clinic prn if such symptoms occur or the knee fails to improve as anticipated.

## 2016-07-31 ENCOUNTER — Encounter: Payer: Self-pay | Admitting: Emergency Medicine

## 2016-07-31 LAB — SYNOVIAL FLUID PANEL
Basophils, %: 0 %
Eosinophils-Synovial: 0 % (ref 0–2)
Glucose, Synovial Fluid: 115 mg/dL
Lymphocytes-Synovial Fld: 0 % (ref 0–74)
Monocyte/Macrophage: 10 % (ref 0–69)
Neutrophil, Synovial: 90 % — ABNORMAL HIGH (ref 0–24)
Protein, Synovial Fluid: 4.6 g/dL — ABNORMAL HIGH (ref 1.0–3.0)
Synoviocytes, %: 0 % (ref 0–15)
WBC, Synovial: 17125 cells/uL — ABNORMAL HIGH (ref <150)

## 2016-07-31 NOTE — Telephone Encounter (Signed)
Pt came in to be seen yesterday and received a Rx.

## 2016-08-02 LAB — BODY FLUID CULTURE
Gram Stain: NONE SEEN
Organism ID, Bacteria: NO GROWTH

## 2016-11-12 ENCOUNTER — Encounter (HOSPITAL_COMMUNITY): Payer: Self-pay | Admitting: *Deleted

## 2016-11-12 ENCOUNTER — Ambulatory Visit (HOSPITAL_COMMUNITY)
Admission: EM | Admit: 2016-11-12 | Discharge: 2016-11-12 | Disposition: A | Discharge status: Home / Self Care | Payer: BLUE CROSS/BLUE SHIELD | Attending: Family Medicine | Admitting: Family Medicine

## 2016-11-12 DIAGNOSIS — M109 Gout, unspecified: Secondary | ICD-10-CM

## 2016-11-12 DIAGNOSIS — M10061 Idiopathic gout, right knee: Secondary | ICD-10-CM | POA: Diagnosis not present

## 2016-11-12 DIAGNOSIS — M79671 Pain in right foot: Secondary | ICD-10-CM

## 2016-11-12 DIAGNOSIS — M25561 Pain in right knee: Secondary | ICD-10-CM

## 2016-11-12 MED ORDER — METHYLPREDNISOLONE ACETATE 80 MG/ML IJ SUSP
INTRAMUSCULAR | Status: AC
  Filled 2016-11-12: qty 1

## 2016-11-12 MED ORDER — COLCHICINE 0.6 MG PO TABS: ORAL_TABLET | ORAL | 1 refills | Status: DC

## 2016-11-12 MED ORDER — HYDROCODONE-ACETAMINOPHEN 10-325 MG PO TABS: 1.0000 | ORAL_TABLET | Freq: Three times a day (TID) | ORAL | 0 refills | Status: DC | PRN

## 2016-11-12 MED ORDER — BUPIVACAINE HCL (PF) 0.5 % IJ SOLN
INTRAMUSCULAR | Status: AC
  Filled 2016-11-12: qty 10

## 2016-11-12 MED ORDER — METHYLPREDNISOLONE ACETATE 80 MG/ML IJ SUSP
80.0000 mg | Freq: Once | INTRAMUSCULAR | Status: AC
  Administered 2016-11-12: 80 mg via INTRAMUSCULAR

## 2016-11-12 NOTE — ED Triage Notes (Signed)
Flair  Up  og  Gout  Yesterday       No   specefic   Injury       History  Of  Gout

## 2016-11-12 NOTE — ED Provider Notes (Signed)
[facility]    CSN: [id number] Arrival date & time: 11/12/16  1951     History   Chief Complaint Chief Complaint  Patient presents with  . Knee Pain    HPI Chase Higgins is a 45 y.o. male.   This is a 45 year old [place] gentleman with a history of gout. This is been a recurrent problem in his right knee. He's been treated with colchicine and indomethacin in the past  He presents with 2 days of knee swelling and pain. He's had no fever or other joint symptoms.  His last gouty attack was in August.      Past Medical History:  Diagnosis Date  . Arthritis   . Diabetes mellitus without complication ([place])   . Gout     Patient Active Problem List   Diagnosis Date Noted  . Gout attack 04/10/2014  . Calcium pyrophosphate arthropathy 02/01/2014  . Diabetes ([place]) 01/31/2014  . Unspecified essential hypertension 01/31/2014  . Hypertriglyceridemia 01/31/2014    History reviewed. No pertinent surgical history.  OB History    No data available       Home Medications    Prior to Admission medications   Medication Sig Start Date End Date Taking? Authorizing Provider  colchicine 0.6 MG tablet Take 2 tablets by mouth, after an hour take 1 tablet. 11/12/16   Robyn Haber, MD  HYDROcodone-acetaminophen (NORCO) 10-325 MG tablet Take 1 tablet by mouth every 8 (eight) hours as needed. 11/12/16   Robyn Haber, MD    Family History Family History  Problem Relation Age of Onset  . Hypertension Mother   . Hyperlipidemia Mother   . Diabetes Father   . Stroke Father     Social History Social History  Substance Use Topics  . Smoking status: Current Every Day Smoker    Packs/day: 0.50    Years: 29.00    Types: Cigarettes  . Smokeless tobacco: Never Used  . Alcohol use Not on file     Allergies   Patient has no known allergies.   Review of Systems Review of Systems  Constitutional: Negative.   Musculoskeletal: Positive for arthralgias.      Physical Exam Triage Vital Signs ED Triage Vitals  Enc Vitals Group     BP 11/12/16 2014 180/90     Pulse Rate 11/12/16 2011 78     Resp 11/12/16 2011 16     Temp 11/12/16 2011 98.6 F (37 C)     Temp Source 11/12/16 2011 Oral     SpO2 11/12/16 2014 100 %     Weight --      Height --      Head Circumference --      Peak Flow --      Pain Score 11/12/16 2010 9     Pain Loc --      Pain Edu? --      Excl. in [place]? --    No data found.   Updated Vital Signs BP 180/90 (BP Location: Right Arm)   Pulse 78   Temp 98.6 F (37 C) (Oral)   Resp 18   SpO2 100%   Visual Acuity Right Eye Distance:   Left Eye Distance:   Bilateral Distance:    Right Eye Near:   Left Eye Near:    Bilateral Near:     Physical Exam  Constitutional: He is oriented to person, place, and time. He appears well-developed and well-nourished.  HENT:  Head: Normocephalic.  Right Ear:  External ear normal.  Left Ear: External ear normal.  Mouth/Throat: Oropharynx is clear and moist.  Eyes: Conjunctivae and EOM are normal.  Neck: Normal range of motion. Neck supple.  Pulmonary/Chest: Effort normal.  Musculoskeletal:  Marked right knee effusion with tenderness.  Neurological: He is alert and oriented to person, place, and time.  Skin: Skin is warm and dry.  Nursing note and vitals reviewed.    UC Treatments / Results  Labs (all labs ordered are listed, but only abnormal results are displayed) Labs Reviewed - No data to display  EKG  EKG Interpretation None       Radiology No results found.  Procedures Procedures (including critical care time)  Medications Ordered in UC Medications  methylPREDNISolone acetate (DEPO-MEDROL) injection 80 mg (not administered)     Initial Impression / Assessment and Plan / UC Course  I have reviewed the triage vital signs and the nursing notes.  Pertinent labs & imaging results that were available during my care of the patient were reviewed  by me and considered in my medical decision making (see chart for details).  Clinical Course     Final Clinical Impressions(s) / UC Diagnoses   Final diagnoses:  Acute idiopathic gout of right knee    New Prescriptions Current Discharge Medication List    Colchicine and hydrocodone ordered for pain control and resolution of gout.   Robyn Haber, MD 11/12/16 2037

## 2016-12-20 ENCOUNTER — Ambulatory Visit (INDEPENDENT_AMBULATORY_CARE_PROVIDER_SITE_OTHER): Payer: BLUE CROSS/BLUE SHIELD | Admitting: Family Medicine

## 2016-12-20 VITALS — BP 130/100 | HR 129 | Temp 98.7°F | Resp 18 | Ht 67.5 in | Wt 205.1 lb

## 2016-12-20 DIAGNOSIS — M109 Gout, unspecified: Secondary | ICD-10-CM | POA: Diagnosis not present

## 2016-12-20 DIAGNOSIS — M25561 Pain in right knee: Secondary | ICD-10-CM

## 2016-12-20 DIAGNOSIS — M79671 Pain in right foot: Secondary | ICD-10-CM | POA: Diagnosis not present

## 2016-12-20 DIAGNOSIS — J069 Acute upper respiratory infection, unspecified: Secondary | ICD-10-CM

## 2016-12-20 DIAGNOSIS — Z23 Encounter for immunization: Secondary | ICD-10-CM

## 2016-12-20 MED ORDER — LIDOCAINE HCL 2 % IJ SOLN
5.0000 mL | Freq: Once | INTRAMUSCULAR | Status: AC
  Administered 2016-12-20: 100 mg

## 2016-12-20 MED ORDER — INDOMETHACIN 50 MG PO CAPS: 50.0000 mg | ORAL_CAPSULE | Freq: Two times a day (BID) | ORAL | 1 refills | Status: DC

## 2016-12-20 MED ORDER — HYDROCODONE-ACETAMINOPHEN 10-325 MG PO TABS: 1.0000 | ORAL_TABLET | Freq: Three times a day (TID) | ORAL | 0 refills | Status: DC | PRN

## 2016-12-20 MED ORDER — COLCHICINE 0.6 MG PO TABS: ORAL_TABLET | ORAL | 0 refills | Status: DC

## 2016-12-20 NOTE — Progress Notes (Signed)
Chief Complaint  Patient presents with  . Gout    C/O right knee swelling/pain x 2-3 days (?fluid)  . Flu Vaccine    HPI   Right knee  Pt reports that he has been having pain in the right knee for 3 days and now there has been swelling for the past 24 hours He reports that he has not taken anything for the gout. He is out of his colchicine.  He has had 3 episodes of gout this year Feb 2017 and August 2017. He reports that he is noticing that his gout is flaring up more but does not know the trigger.  Cough Started 2-3 days ago when the weather changed Non productive No fevers or chills No wheezing No sick contacts No history of asthma He is diabetic   Past Medical History:  Diagnosis Date  . Arthritis   . Diabetes mellitus without complication ([place])   . Gout     Current Outpatient Prescriptions  Medication Sig Dispense Refill  . colchicine (COLCRYS) 0.6 MG tablet Take 2 tablets by mouth, after an hour take 1 tablet. 20 tablet 0  . HYDROcodone-acetaminophen (NORCO) 10-325 MG tablet Take 1 tablet by mouth every 8 (eight) hours as needed. 15 tablet 0  . indomethacin (INDOCIN) 50 MG capsule Take 1 capsule (50 mg total) by mouth 2 (two) times daily with a meal. 60 capsule 1   No current facility-administered medications for this visit.     Allergies: No Known Allergies  No past surgical history on file.  Social History   Social History  . Marital status: Married    Spouse name: N/A  . Number of children: N/A  . Years of education: 73   Social History Main Topics  . Smoking status: Current Every Day Smoker    Packs/day: 0.50    Years: 29.00    Types: Cigarettes  . Smokeless tobacco: Never Used  . Alcohol use None  . Drug use:     Types: Cocaine, Marijuana     Comment: Hx per record  . Sexual activity: No   Other Topics Concern  . None   Social History Narrative  . None    ROS  Objective: Vitals:   12/20/16 1445  BP: (!) 130/100  Pulse: (!) 129    Resp: 18  Temp: 98.7 F (37.1 C)  TempSrc: Oral  SpO2: 97%  Weight: 205 lb 2 oz (93 kg)  Height: 5' 7.5" (1.715 m)    Physical Exam General: alert, oriented, in NAD Head: normocephalic, atraumatic, no sinus tenderness Eyes: EOM intact, no scleral icterus or conjunctival injection Ears: TM clear bilaterally Throat: no pharyngeal exudate or erythema Lymph: no posterior auricular, submental or cervical lymph adenopathy Heart: normal rate, normal sinus rhythm, no murmurs Lungs: clear to auscultation bilaterally, no wheezing  Right knee exam tender along the medial border and along the joint line. Area of effusion noted. Along the medial border. No crepitus.  Left knee normal Procedure Note Information reviewed. Verbal consent given. Time out performed. Right knee medial border cleaned with Betadine. 18 gauge needle inserted into small pocket of effusion.  No fluid was aspirated. Angle was changed and there was only a flash in the needle.  Changed syringe and injected 40mL of lidocaine 2% without epi. Pt tolerated procedure well.    Assessment and Plan My[place]Suk was seen today for gout and flu vaccine.  Diagnoses and all orders for this visit: URI- supprotive care, no  Need for antibiotics  at this time  Acute gout of right knee, unspecified cause -     colchicine (COLCRYS) 0.6 MG tablet; Take 2 tablets by mouth, after an hour take 1 tablet. -     lidocaine (XYLOCAINE) 2 % (with pres) injection 100 mg; 5 mLs (100 mg total) by Other route once. -     Cancel: Synovial fluid, cell count -     HYDROcodone-acetaminophen (NORCO) 10-325 MG tablet; Take 1 tablet by mouth every 8 (eight) hours as needed.  Encounter for immunization -     Flu Vaccine QUAD 36+ mos IM  Acute pain of right knee- with intraarticular injection aspiration there was very little fluid return. Not even enough to do a fluid analysis Will treat empirically for gout -     colchicine (COLCRYS) 0.6 MG tablet; Take 2  tablets by mouth, after an hour take 1 tablet. -     HYDROcodone-acetaminophen (NORCO) 10-325 MG tablet; Take 1 tablet by mouth every 8 (eight) hours as needed. -     indomethacin (INDOCIN) 50 MG capsule; Take 1 capsule (50 mg total) by mouth 2 (two) times daily with a meal.  Right foot pain -     colchicine (COLCRYS) 0.6 MG tablet; Take 2 tablets by mouth, after an hour take 1 tablet.  Other orders      [place]

## 2016-12-20 NOTE — Progress Notes (Signed)
     IF you received an x-ray today, you will receive an invoice from [facility]. Please contact [facility] at [phone] with questions or concerns regarding your invoice.   IF you received labwork today, you will receive an invoice from [facility]. Please contact [facility] at [phone] with questions or concerns regarding your invoice.   Our billing staff will not be able to assist you with questions regarding bills from these companies.  You will be contacted with the lab results as soon as they are available. The fastest way to get your results is to activate your My Chart account. Instructions are located on the last page of this paperwork. If you have not heard from us regarding the results in 2 weeks, please contact this office.     

## 2016-12-20 NOTE — Patient Instructions (Addendum)

## 2016-12-22 ENCOUNTER — Telehealth: Payer: Self-pay

## 2016-12-22 NOTE — Telephone Encounter (Signed)
colcrys 0.6 not covered  Pa [id number] Id [id number]

## 2016-12-26 NOTE — Telephone Encounter (Signed)
No pa needed brand colcrys is covered  [phone]  4    1   Pharmacy advised

## 2017-03-31 ENCOUNTER — Ambulatory Visit (INDEPENDENT_AMBULATORY_CARE_PROVIDER_SITE_OTHER): Payer: BLUE CROSS/BLUE SHIELD | Admitting: Physician Assistant

## 2017-03-31 ENCOUNTER — Ambulatory Visit (INDEPENDENT_AMBULATORY_CARE_PROVIDER_SITE_OTHER): Payer: BLUE CROSS/BLUE SHIELD

## 2017-03-31 VITALS — BP 120/100 | HR 83 | Temp 97.9°F | Resp 16 | Ht 68.0 in | Wt 212.0 lb

## 2017-03-31 DIAGNOSIS — M25562 Pain in left knee: Secondary | ICD-10-CM

## 2017-03-31 DIAGNOSIS — M1712 Unilateral primary osteoarthritis, left knee: Secondary | ICD-10-CM

## 2017-03-31 MED ORDER — NAPROXEN 500 MG PO TABS: 500.0000 mg | ORAL_TABLET | Freq: Two times a day (BID) | ORAL | 0 refills | Status: DC

## 2017-03-31 MED ORDER — TRAMADOL-ACETAMINOPHEN 37.5-325 MG PO TABS: 1.0000 | ORAL_TABLET | Freq: Four times a day (QID) | ORAL | 0 refills | Status: DC | PRN
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
## 2017-03-31 NOTE — Patient Instructions (Addendum)
For knee swelling, please continue to ice daily. Take naproxen every 12 hours for pain. You may use ultracet for breakthrough pain every 6-8 hours. Please keep the knee in a brace until the swelling subsides. If you like the ace wrap we applied in office better, that is fine to use daily as well.  I have placed a referral for orthopedics and they should contact you in the next week, if you do not hear from them in a week, please call our office. If you develop any worsening swelling, pain, redness, or warmth please seek care immediately. Thank you for letting me participate in your health and well being.   Knee Sprain A knee sprain is a stretch or tear in a knee ligament. Knee ligaments are bands of tissue that connect bones in the knee to each other. Follow these instructions at home: If you have a splint or brace:   Wear the splint or brace as told by your doctor. Remove it only as told by your doctor.  Loosen the splint or brace if your toes tingle, get numb, or turn cold and blue.  Keep the splint or brace clean.  If the splint or brace is not waterproof:  Do not let it get wet.  Cover it with a watertight covering when you take a bath or a shower. If you have a cast:   Do not stick anything inside the cast to scratch your skin.  Check the skin around the cast every day. Tell your doctor about any concerns.  You may put lotion on dry skin around the edges of the cast. Do not put lotion on the skin underneath the cast.  Keep the cast clean.  If the cast is not waterproof:  Do not let it get wet.  Cover it with a watertight covering when you take a bath or a shower. Managing pain, stiffness, and swelling   Gently move your toes often to avoid stiffness and to lessen swelling.  Raise (elevate) the injured area above the level of your heart while you are sitting or lying down.  Take over-the-counter and prescription medicines only as told by your doctor.  If directed, put  ice on the injured area.  If you have a removable splint or brace, remove it as told by your doctor.  Put ice in a plastic bag.  Place a towel between your skin and the bag or between your cast and the bag.  Leave the ice on for 20 minutes, 2-3 times a day. General instructions   Do exercises as told by your doctor.  Keep all follow-up visits as told by your doctor. This is important. Contact a doctor if:  You have pain that gets worse.  The cast, brace, or splint does not fit right.  The cast, brace, or splint gets damaged. Get help right away if:  You cannot lean on your knee to stand or walk.  You cannot move the injured area.  You knee buckles or you have pain after you walk only a few steps.  You have very bad pain, swelling, or numbness below the cast, brace, or splint. Summary  A knee sprain is a stretch or tear in a band (ligament) that connects your knee bones to each other.  You may need to wear a splint, brace, or cast to help your knee get better.  Contact your doctor if you have very bad pain, swelling, or numbness, or if you cannot walk. This information is not  intended to replace advice given to you by your health care provider. Make sure you discuss any questions you have with your health care provider. Document Released: 11/12/2009 Document Revised: 08/12/2016 Document Reviewed: 08/12/2016 Elsevier Interactive Patient Education  2017 [employer].    IF you received an x-ray today, you will receive an invoice from [facility]. Please contact [facility] at [phone] with questions or concerns regarding your invoice.   IF you received labwork today, you will receive an invoice from [facility]. Please contact [facility] at [phone] with questions or concerns regarding your invoice.   Our billing staff will not be able to assist you with questions regarding bills from these companies.  You will be contacted with the lab results as  soon as they are available. The fastest way to get your results is to activate your My Chart account. Instructions are located on the last page of this paperwork. If you have not heard from [place] regarding the results in 2 weeks, please contact this office.

## 2017-04-02 ENCOUNTER — Ambulatory Visit (INDEPENDENT_AMBULATORY_CARE_PROVIDER_SITE_OTHER): Payer: BLUE CROSS/BLUE SHIELD | Admitting: Physician Assistant

## 2017-04-02 VITALS — BP 130/90 | HR 90 | Temp 98.5°F | Resp 18 | Ht 67.0 in | Wt 208.2 lb

## 2017-04-02 DIAGNOSIS — Z130 Encounter for screening for diseases of the blood and blood-forming organs and certain disorders involving the immune mechanism: Secondary | ICD-10-CM | POA: Diagnosis not present

## 2017-04-02 DIAGNOSIS — Z13220 Encounter for screening for lipoid disorders: Secondary | ICD-10-CM | POA: Diagnosis not present

## 2017-04-02 DIAGNOSIS — R5383 Other fatigue: Secondary | ICD-10-CM

## 2017-04-02 DIAGNOSIS — Z0000 Encounter for general adult medical examination without abnormal findings: Secondary | ICD-10-CM

## 2017-04-02 DIAGNOSIS — Z13228 Encounter for screening for other metabolic disorders: Secondary | ICD-10-CM

## 2017-04-02 DIAGNOSIS — Z125 Encounter for screening for malignant neoplasm of prostate: Secondary | ICD-10-CM | POA: Diagnosis not present

## 2017-04-02 DIAGNOSIS — Z1329 Encounter for screening for other suspected endocrine disorder: Secondary | ICD-10-CM

## 2017-04-02 LAB — POCT URINALYSIS DIP (MANUAL ENTRY)
Bilirubin, UA: NEGATIVE
Blood, UA: NEGATIVE
Glucose, UA: NEGATIVE mg/dL
Ketones, POC UA: NEGATIVE mg/dL
Leukocytes, UA: NEGATIVE
Nitrite, UA: NEGATIVE
Protein Ur, POC: NEGATIVE mg/dL
Spec Grav, UA: 1.02 (ref 1.010–1.025)
Urobilinogen, UA: 0.2 E.U./dL
pH, UA: 5 (ref 5.0–8.0)

## 2017-04-02 LAB — CBC
Hematocrit: 48.3 % (ref 37.5–51.0)
Hemoglobin: 16.8 g/dL (ref 13.0–17.7)
MCH: 31 pg (ref 26.6–33.0)
MCHC: 34.8 g/dL (ref 31.5–35.7)
MCV: 89 fL (ref 79–97)
Platelets: 273 10*3/uL (ref 150–379)
RBC: 5.42 x10E6/uL (ref 4.14–5.80)
RDW: 13.6 % (ref 12.3–15.4)
WBC: 8.9 10*3/uL (ref 3.4–10.8)

## 2017-04-02 NOTE — Progress Notes (Signed)
PRIMARY CARE AT [facility] [address] [place] [address] [phone]  Date:  04/02/2017   Name:  Chase Higgins   DOB:  Jun 30, 1971   MRN:  [id number]  PCP:  No PCP Per Patient    History of Present Illness:  Chase Higgins is a 46 y.o. male patient who presents to PCP with  Chief Complaint  Patient presents with  . Annual Exam     DIET: he is eating meat, potatoes, rice, and fruits, and vegetables (cabbage, seaweed).  Caffeine intake: sodas and coffee vary (not daily).  Water intake: 160oz about.    BM: varies once- three times daily.  No blood or black stool.  No constipation, but may have frequent diarrhea.    URINATION: urinary frequency, difficulty controlling.    SLEEP: difficulty with getting to sleep which started the last couple months.  He generally goes to bed around 11:30pm-6am wake time.  He has no difficulty staying asleep.    Married: so so for 20 years, feels like he is going through motions.  Working is good.    He took benadryl which helped.  He stressors  Currently regarding family that has been going on for 1 year.  He has noted increased agitation.  He has lost interest in doing the things he normally does such as golf and home chores, No SI/HI, no change in appetite, feelings of guilt or worthlessness.   He has noticed some vision changes.  SOCIAL ACTIVITY He is not exercising currently for a couple of years.    He reports very thirsty, diarrhea, and heavy fatigue.    Results for orders placed or performed in visit on 04/02/17  POCT urinalysis dipstick  Result Value Ref Range   Color, UA yellow yellow   Clarity, UA clear clear   Glucose, UA negative negative mg/dL   Bilirubin, UA negative negative   Ketones, POC UA negative negative mg/dL   Spec Grav, UA 1.020 1.010 - 1.025   Blood, UA negative negative   pH, UA 5.0 5.0 - 8.0   Protein Ur, POC negative negative mg/dL   Urobilinogen, UA 0.2 0.2 or 1.0 E.U./dL   Nitrite, UA Negative Negative   Leukocytes, UA Negative Negative      Patient Active Problem List   Diagnosis Date Noted  . Gout attack 04/10/2014  . Calcium pyrophosphate arthropathy 02/01/2014  . Diabetes ([place]) 01/31/2014  . Unspecified essential hypertension 01/31/2014  . Hypertriglyceridemia 01/31/2014    Past Medical History:  Diagnosis Date  . Arthritis   . Diabetes mellitus without complication ([place])   . Gout     No past surgical history on file.  Social History  Substance Use Topics  . Smoking status: Current Every Day Smoker    Packs/day: 0.50    Years: 29.00    Types: Cigarettes  . Smokeless tobacco: Never Used  . Alcohol use Not on file    Family History  Problem Relation Age of Onset  . Hypertension Mother   . Hyperlipidemia Mother   . Diabetes Father   . Stroke Father     No Known Allergies  Medication list has been reviewed and updated.  Current Outpatient Prescriptions on File Prior to Visit  Medication Sig Dispense Refill  . naproxen (NAPROSYN) 500 MG tablet Take 1 tablet (500 mg total) by mouth 2 (two) times daily with a meal. 30 tablet 0  . traMADol-acetaminophen (ULTRACET) 37.5-325 MG tablet Take 1 tablet by mouth every 6 (six) hours as  needed. 15 tablet 0  . colchicine (COLCRYS) 0.6 MG tablet Take 2 tablets by mouth, after an hour take 1 tablet. (Patient not taking: Reported on 04/02/2017) 20 tablet 0  . HYDROcodone-acetaminophen (NORCO) 10-325 MG tablet Take 1 tablet by mouth every 8 (eight) hours as needed. (Patient not taking: Reported on 04/02/2017) 15 tablet 0  . indomethacin (INDOCIN) 50 MG capsule Take 1 capsule (50 mg total) by mouth 2 (two) times daily with a meal. (Patient not taking: Reported on 04/02/2017) 60 capsule 1   No current facility-administered medications on file prior to visit.     ROS ROS otherwise unremarkable unless listed above.  Physical Examination: BP 130/90   Pulse 90   Temp 98.5 F (36.9 C) (Oral)   Resp 18   Ht 5\' 7"  (1.702 m)   Wt  208 lb 3.2 oz (94.4 kg)   SpO2 96%   BMI 32.61 kg/m  Ideal Body Weight: Weight in (lb) to have BMI = 25: 159.3  Physical Exam  Constitutional: He is oriented to person, place, and time. He appears well-developed and well-nourished. No distress.  HENT:  Head: Normocephalic and atraumatic.  Right Ear: Tympanic membrane, external ear and ear canal normal.  Left Ear: Tympanic membrane, external ear and ear canal normal.  Eyes: Conjunctivae and EOM are normal. Pupils are equal, round, and reactive to light.  Cardiovascular: Normal rate and regular rhythm.  Exam reveals no friction rub.   No murmur heard. Pulmonary/Chest: Effort normal. No respiratory distress. He has no wheezes.  Abdominal: Soft. Bowel sounds are normal. He exhibits no distension and no mass. There is no tenderness.  Genitourinary: Prostate normal.  Musculoskeletal: Normal range of motion. He exhibits no edema or tenderness.  Neurological: He is alert and oriented to person, place, and time. He displays normal reflexes.  Skin: Skin is warm and dry. He is not diaphoretic.  Psychiatric: He has a normal mood and affect. His behavior is normal.      Visual Acuity Screening   Right eye Left eye Both eyes  Without correction: 20/40 20/40 20/30/2  With correction:       Results for orders placed or performed in visit on 07/30/16  Body fluid culture  Result Value Ref Range   Gram Stain Abundant    Gram Stain WBC present-predominately PMN    Gram Stain No Organisms Seen    Organism ID, Bacteria NO GROWTH 3 DAYS   Synovial Fluid Panel  Result Value Ref Range   Protein, Synovial Fluid 4.6 (H) 1.0 - 3.0 g/dL   Site UNSPECIFIED    Color, Synovial YELLOW STRAW/YELLOW   Appearance-Synovial HAZY CLEAR/HAZY   WBC, Synovial 17,125 (H) <150 cells/uL   Neutrophil, Synovial 90 (H) 0 - 24 %   Lymphocytes-Synovial Fld 0 0 - 74 %   Monocyte/Macrophage 10 0 - 69 %   Eosinophils-Synovial 0 0 - 2 %   Basophils, % 0 0 %    Synoviocytes, % 0 0 - 15 %   Crystals SEE NOTE NONE SEEN HPF   Glucose, Synovial Fluid 115 mg/dL      Assessment and Plan: Chase Higgins is a 46 y.o. male who is here today for cc of cpe. Screening labs performed today.  Patient's symptoms aligned with diabetes, possible thyroid.  These are included in his screening.  If these are normal, may consider that this may be manifestations (fatigue) of a psychologic etiology.   Annual physical exam - Plan: CBC, CMP14+EGFR, TSH, Lipid  panel, PSA  Screening for deficiency anemia - Plan: CBC  Screening for metabolic disorder - Plan: CMP14+EGFR  Screening for thyroid disorder - Plan: TSH  Screening for lipid disorders - Plan: Lipid panel  Screening for prostate cancer - Plan: PSA  Other fatigue - Plan: POCT urinalysis dipstick  Ivar Drape, PA-C [facility] 4/30/20188:26 AM

## 2017-04-02 NOTE — Patient Instructions (Addendum)
IF you received an x-ray today, you will receive an invoice from [facility]. Please contact [facility] at [phone] with questions or concerns regarding your invoice.   IF you received labwork today, you will receive an invoice from [place]. Please contact [facility] at [phone] with questions or concerns regarding your invoice.   Our billing staff will not be able to assist you with questions regarding bills from these companies.  You will be contacted with the lab results as soon as they are available. The fastest way to get your results is to activate your My Chart account. Instructions are located on the last page of this paperwork. If you have not heard from [place] regarding the results in 2 weeks, please contact this office.     Keeping you healthy  Get these tests  Blood pressure- Have your blood pressure checked once a year by your healthcare provider.  Normal blood pressure is 120/80.  Weight- Have your body mass index (BMI) calculated to screen for obesity.  BMI is a measure of body fat based on height and weight. You can also calculate your own BMI at [employer]velBags.it.  Cholesterol- Have your cholesterol checked regularly starting at age 4, sooner may be necessary if you have diabetes, high blood pressure, if a family member developed heart diseases at an early age or if you smoke.   Chlamydia, HIV, and other sexual transmitted disease- Get screened each year until the age of 69 then within three months of each new sexual partner.  Diabetes- Have your blood sugar checked regularly if you have high blood pressure, high cholesterol, a family history of diabetes or if you are overweight.  Get these vaccines  Flu shot- Every fall.  Tetanus shot- Every 10 years.  Menactra- Single dose; prevents meningitis.  Take these steps  Don't smoke- If you do smoke, ask your healthcare provider about quitting. For tips on how to quit, go to  www.smokefree.gov or call 1-800-QUIT-NOW.  Be physically active- Exercise 5 days a week for at least 30 minutes.  If you are not already physically active start slow and gradually work up to 30 minutes of moderate physical activity.  Examples of moderate activity include walking briskly, mowing the yard, dancing, swimming bicycling, etc.  Eat a healthy diet- Eat a variety of healthy foods such as fruits, vegetables, low fat milk, low fat cheese, yogurt, lean meats, poultry, fish, beans, tofu, etc.  For more information on healthy eating, go to www.thenutritionsource.org  Drink alcohol in moderation- Limit alcohol intake two drinks or less a day.  Never drink and drive.  Dentist- Brush and floss teeth twice daily; visit your dentis twice a year.  Depression-Your emotional health is as important as your physical health.  If you're feeling down, losing interest in things you normally enjoy please talk with your healthcare provider.  Gun Safety- If you keep a gun in your home, keep it unloaded and with the safety lock on.  Bullets should be stored separately.  Helmet use- Always wear a helmet when riding a motorcycle, bicycle, rollerblading or skateboarding.  Safe sex- If you may be exposed to a sexually transmitted infection, use a condom  Seat belts- Seat bels can save your life; always wear one.  Smoke/Carbon Monoxide detectors- These detectors need to be installed on the appropriate level of your home.  Replace batteries at least once a year.  Skin Cancer- When out in the sun, cover up and use sunscreen SPF 34 or  higher.  Violence- If anyone is threatening or hurting you, please tell your healthcare provider. 

## 2017-04-03 ENCOUNTER — Ambulatory Visit (INDEPENDENT_AMBULATORY_CARE_PROVIDER_SITE_OTHER): Payer: BLUE CROSS/BLUE SHIELD | Admitting: Orthopaedic Surgery

## 2017-04-03 DIAGNOSIS — M25462 Effusion, left knee: Secondary | ICD-10-CM | POA: Diagnosis not present

## 2017-04-03 DIAGNOSIS — M25562 Pain in left knee: Secondary | ICD-10-CM | POA: Diagnosis not present

## 2017-04-03 LAB — CMP14+EGFR
ALT: 29 IU/L (ref 0–44)
AST: 11 IU/L (ref 0–40)
Albumin/Globulin Ratio: 1.5 (ref 1.2–2.2)
Albumin: 4.4 g/dL (ref 3.5–5.5)
Alkaline Phosphatase: 87 IU/L (ref 39–117)
BUN/Creatinine Ratio: 12 (ref 9–20)
BUN: 14 mg/dL (ref 6–24)
Bilirubin Total: 0.2 mg/dL (ref 0.0–1.2)
CO2: 24 mmol/L (ref 18–29)
Calcium: 9.4 mg/dL (ref 8.7–10.2)
Chloride: 101 mmol/L (ref 96–106)
Creatinine, Ser: 1.18 mg/dL (ref 0.76–1.27)
GFR calc Af Amer: 84 mL/min/{1.73_m2} (ref >59)
GFR calc non Af Amer: 73 mL/min/{1.73_m2} (ref >59)
Globulin, Total: 2.9 g/dL (ref 1.5–4.5)
Glucose: 112 mg/dL — ABNORMAL HIGH (ref 65–99)
Potassium: 4.4 mmol/L (ref 3.5–5.2)
Sodium: 144 mmol/L (ref 134–144)
Total Protein: 7.3 g/dL (ref 6.0–8.5)

## 2017-04-03 LAB — LIPID PANEL
Chol/HDL Ratio: 7.1 ratio — ABNORMAL HIGH (ref 0.0–5.0)
Cholesterol, Total: 227 mg/dL — ABNORMAL HIGH (ref 100–199)
HDL: 32 mg/dL — ABNORMAL LOW (ref >39)
Triglycerides: 526 mg/dL — ABNORMAL HIGH (ref 0–149)

## 2017-04-03 LAB — PSA: Prostate Specific Ag, Serum: 1.9 ng/mL (ref 0.0–4.0)

## 2017-04-03 LAB — TSH: TSH: 1.58 u[IU]/mL (ref 0.450–4.500)

## 2017-04-03 MED ORDER — MELOXICAM 7.5 MG PO TABS: 7.5000 mg | ORAL_TABLET | Freq: Two times a day (BID) | ORAL | 2 refills | Status: DC | PRN

## 2017-04-03 MED ORDER — METHYLPREDNISOLONE ACETATE 40 MG/ML IJ SUSP
40.0000 mg | INTRAMUSCULAR | Status: AC | PRN
  Administered 2017-04-03: 40 mg via INTRA_ARTICULAR

## 2017-04-03 MED ORDER — LIDOCAINE HCL 1 % IJ SOLN
2.0000 mL | INTRAMUSCULAR | Status: AC | PRN
  Administered 2017-04-03: 2 mL

## 2017-04-03 MED ORDER — BUPIVACAINE HCL 0.5 % IJ SOLN
2.0000 mL | INTRAMUSCULAR | Status: AC | PRN
  Administered 2017-04-03: 2 mL via INTRA_ARTICULAR

## 2017-04-03 NOTE — Addendum Note (Signed)
Addended by: Meyer Cory on: 04/03/2017 10:13 AM   Modules accepted: Orders

## 2017-04-03 NOTE — Progress Notes (Signed)
Office Visit Note   Patient: Chase Higgins           Date of Birth: 14-Jul-1971           MRN: [id number] Visit Date: 04/03/2017              Requested by: Leonie Douglas, PA-C [place], [place] [address] PCP: No PCP Per Patient   Assessment & Plan: Visit Diagnoses:  1. Effusion, left knee     Plan: Left knee was aspirated and injected with cortisone. Patient has had previous anterior cruciate ligament tear with recurrent effusions. Recommend MRI of the left knee to fully evaluate the knee for structural abnormalities and degree of degenerative changes. He does have some mild medial compartment narrowing on x-ray. Follow-up after the MRI.  Follow-Up Instructions: Return in about 10 days (around 04/13/2017).   Orders:  No orders of the defined types were placed in this encounter.  No orders of the defined types were placed in this encounter.     Procedures: Large Joint Inj Date/Time: 04/03/2017 9:53 AM Performed by: Leandrew Koyanagi Authorized by: Leandrew Koyanagi   Consent Given by:  Patient Timeout: prior to procedure the correct patient, procedure, and site was verified   Indications:  Pain Location:  Knee Site:  R knee Prep: patient was prepped and draped in usual sterile fashion   Needle Size:  22 G Ultrasound Guidance: No   Fluoroscopic Guidance: No   Arthrogram: No   Medications:  2 mL bupivacaine 0.5 %; 2 mL lidocaine 1 %; 40 mg methylPREDNISolone acetate 40 MG/ML Aspirate amount (mL):  30 Patient tolerance:  Patient tolerated the procedure well with no immediate complications      Clinical Data: No additional findings.   Subjective: Chief Complaint  Patient presents with  . Left Knee - Pain    Patient is a 46 year old gentleman who comes in with left knee pain and swelling. He had an anterior cruciate ligament tear about ago that was diagnosed on MRI. He elected for nonoperative treatment. He says recently that he's had more pain in his left  knee that's associated with swelling with minimal activity. His right knee started to hurt because of compensation. The pain does not radiate. He does endorse instability and buckling of the left knee. Pain does not radiate. Denies constitutional symptoms.    Review of Systems  Constitutional: Negative.   All other systems reviewed and are negative.    Objective: Vital Signs: There were no vitals taken for this visit.  Physical Exam  Constitutional: He is oriented to person, place, and time. He appears well-developed and well-nourished.  HENT:  Head: Normocephalic and atraumatic.  Eyes: Pupils are equal, round, and reactive to light.  Neck: Neck supple.  Pulmonary/Chest: Effort normal.  Abdominal: Soft.  Musculoskeletal: Normal range of motion.  Neurological: He is alert and oriented to person, place, and time.  Skin: Skin is warm.  Psychiatric: He has a normal mood and affect. His behavior is normal. Judgment and thought content normal.  Nursing note and vitals reviewed.   Ortho Exam Left knee exam shows a moderate joint effusion. Range of motion is near normal. Collaterals are stable. Lachman and anterior drawer are stable. Specialty Comments:  No specialty comments available.  Imaging: No results found.   PMFS History: Patient Active Problem List   Diagnosis Date Noted  . Gout attack 04/10/2014  . Calcium pyrophosphate arthropathy 02/01/2014  . Diabetes ([place]) 01/31/2014  .  Unspecified essential hypertension 01/31/2014  . Hypertriglyceridemia 01/31/2014   Past Medical History:  Diagnosis Date  . Arthritis   . Diabetes mellitus without complication ([place])   . Gout     Family History  Problem Relation Age of Onset  . Hypertension Mother   . Hyperlipidemia Mother   . Diabetes Father   . Stroke Father     No past surgical history on file. Social History   Occupational History  . Not on file.   Social History Main Topics  . Smoking status: Current Every  Day Smoker    Packs/day: 0.50    Years: 29.00    Types: Cigarettes  . Smokeless tobacco: Never Used  . Alcohol use Not on file  . Drug use: Yes    Types: Cocaine, Marijuana     Comment: Hx per record  . Sexual activity: No

## 2017-04-03 NOTE — Addendum Note (Signed)
Addended by: Azucena Cecil on: 04/03/2017 10:11 AM   Modules accepted: Orders

## 2017-04-06 ENCOUNTER — Telehealth (INDEPENDENT_AMBULATORY_CARE_PROVIDER_SITE_OTHER): Payer: Self-pay | Admitting: *Deleted

## 2017-04-06 NOTE — Telephone Encounter (Signed)
Sure #20. 1 tab po bid prn

## 2017-04-06 NOTE — Telephone Encounter (Signed)
Please advise 

## 2017-04-06 NOTE — Telephone Encounter (Signed)
Pt called stating saw XU last Thurs and draiined L knee, but is having worsening pain then before, pain scale is 7-8 out of 10. Wants to know if can prescribe some pain medication for him. Says has taking Tramadol before and "did not work". Pt is requesting Hydrocodone.   C/B# [phone]

## 2017-04-07 MED ORDER — HYDROCODONE-ACETAMINOPHEN 5-325 MG PO TABS
ORAL_TABLET | ORAL | 0 refills | Status: DC
Start: 2017-04-07 — End: 2018-01-15

## 2017-04-07 NOTE — Telephone Encounter (Signed)
rx printed ready for pickup at the front desk. Pt aware

## 2017-04-13 ENCOUNTER — Ambulatory Visit (INDEPENDENT_AMBULATORY_CARE_PROVIDER_SITE_OTHER): Payer: BLUE CROSS/BLUE SHIELD | Admitting: Orthopaedic Surgery

## 2017-04-21 ENCOUNTER — Telehealth: Payer: Self-pay | Admitting: Physician Assistant

## 2017-04-21 NOTE — Telephone Encounter (Signed)
I have tried calling the patient to come back in for redraw for a  hemoglobin A1C. I was unable to leave a voicemail. I will try back again tomorrow SJ

## 2017-12-28 IMAGING — DX DG KNEE COMPLETE 4+V*R*
4 series · 4 of 4 positions shown · non-contrast
Comparison: 01/31/2014.

CLINICAL DATA: Gout.  Pain.  Initial evaluation

EXAM:
RIGHT KNEE - COMPLETE 4+ VIEW

[knee ap]
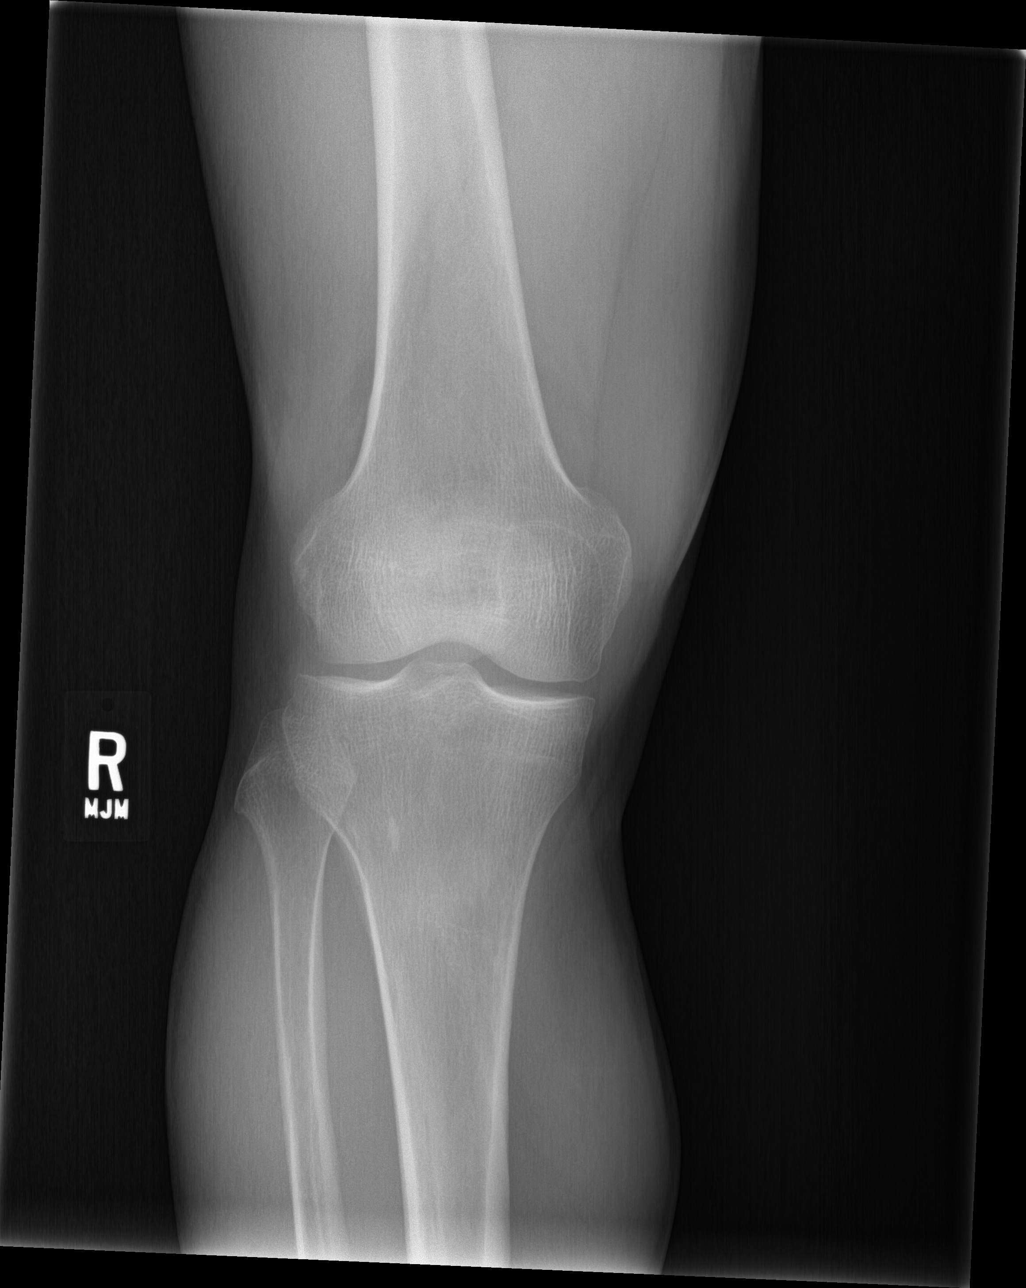

[knee obl (1 of 2)]
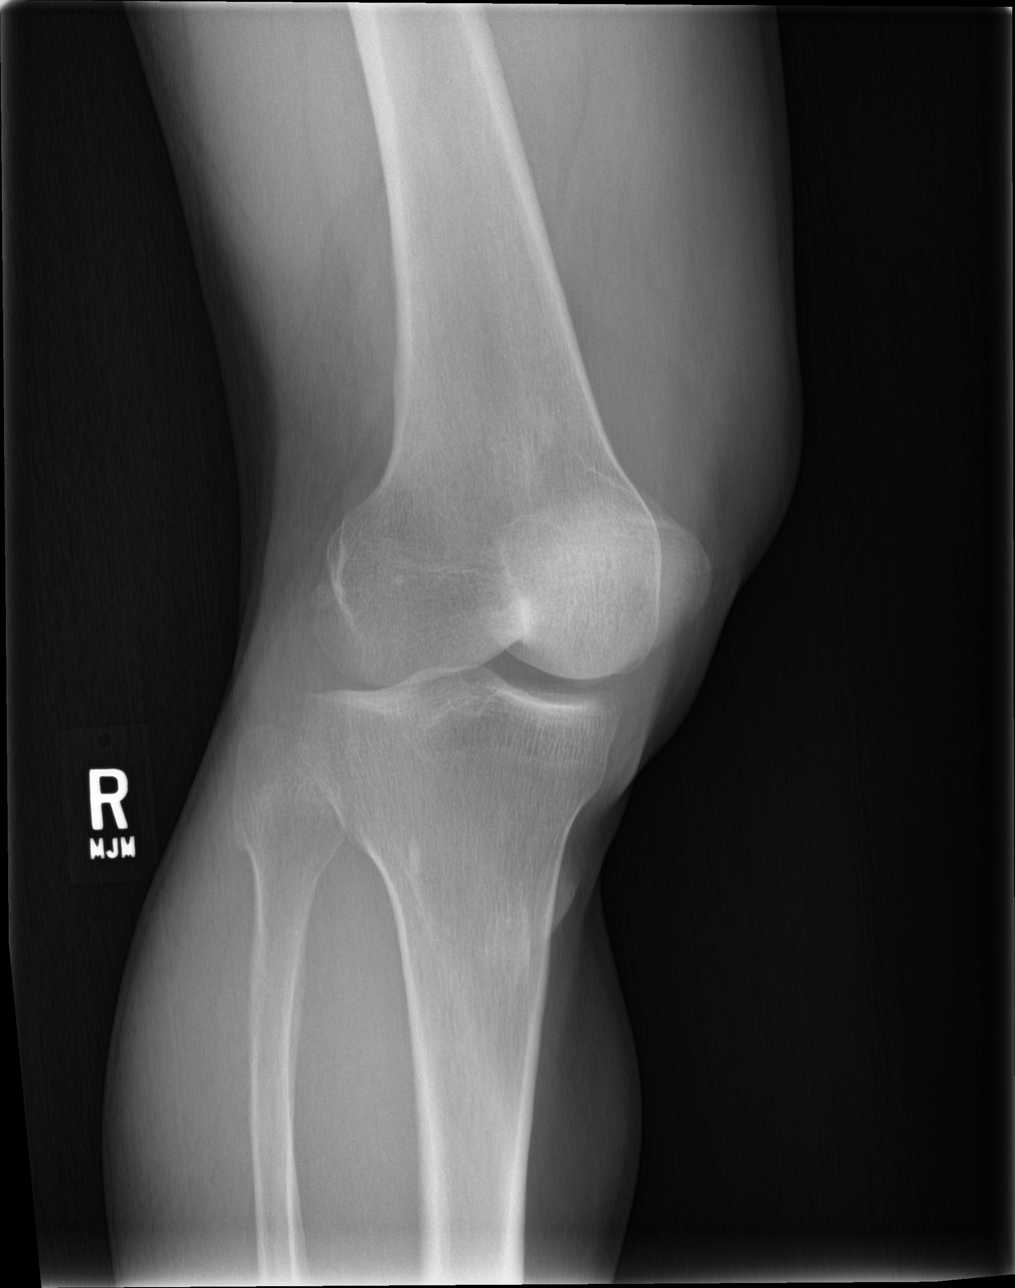

[knee obl (2 of 2)]
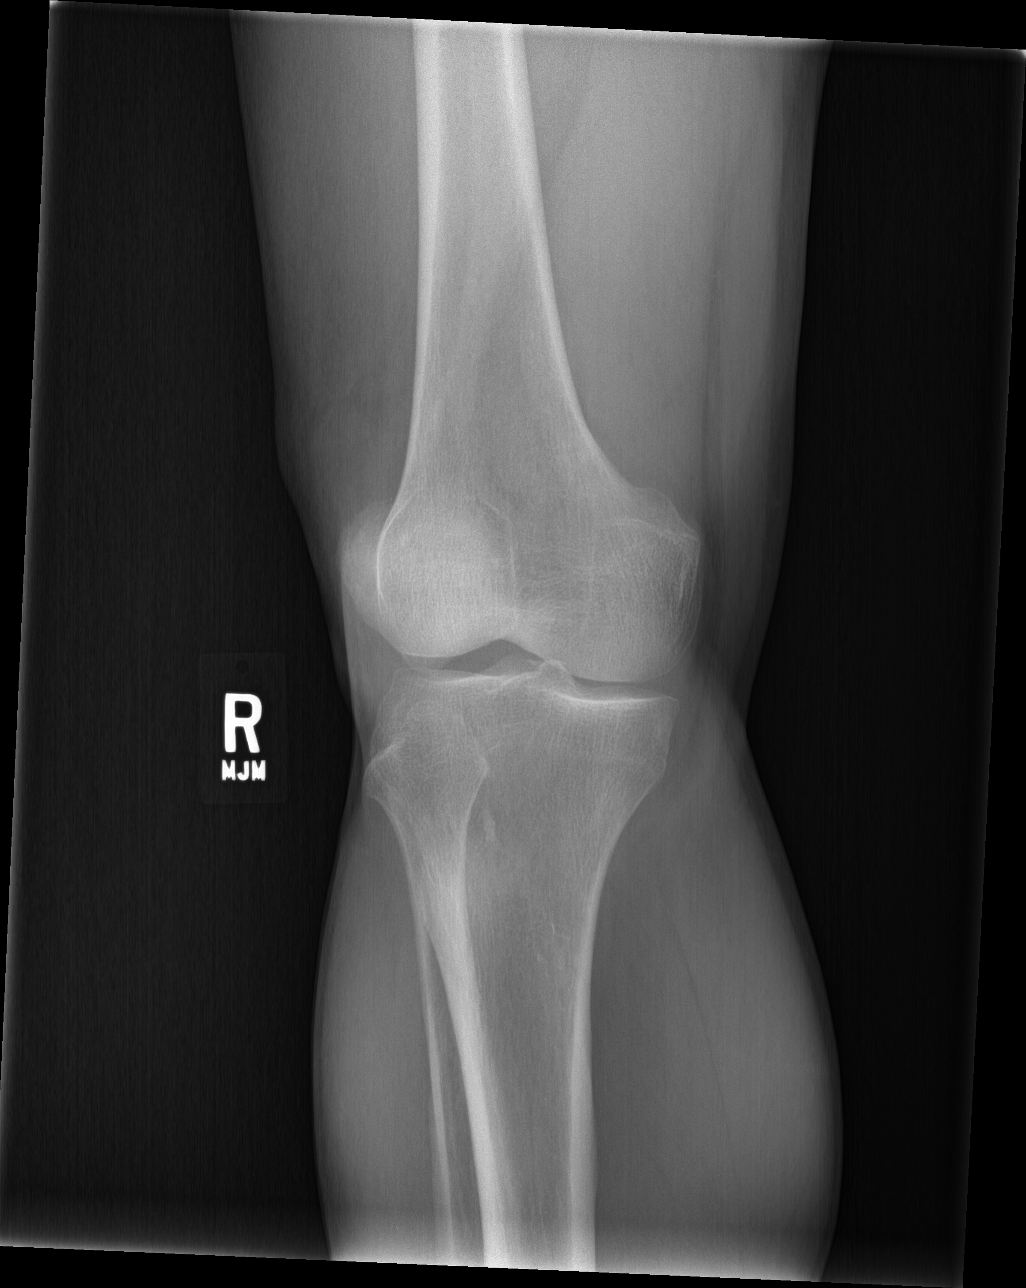

[knee lat]
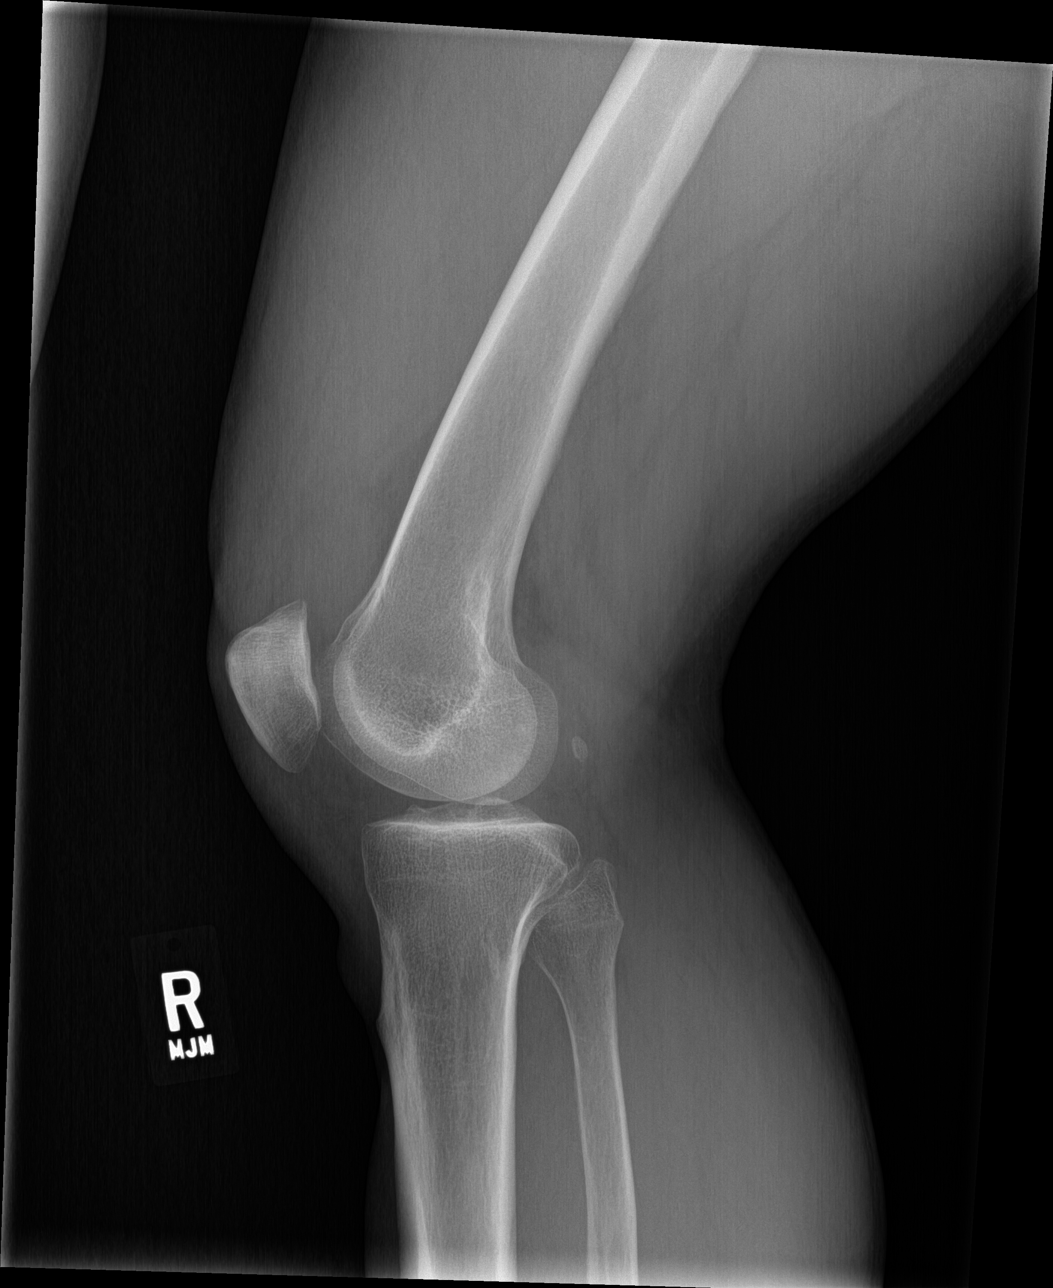

[4 of 4 positions shown; findings below may reference images not displayed]

FINDINGS: Knee joint effusion is noted. Mild tricompartment degenerative
change. No evidence all fracture or dislocation .
IMPRESSION: 1. Knee joint effusion.

2. Mild tricompartment degenerative change . No acute or erosive
bony abnormality.

## 2018-01-15 ENCOUNTER — Ambulatory Visit: Payer: BLUE CROSS/BLUE SHIELD | Admitting: Family Medicine

## 2018-01-15 ENCOUNTER — Other Ambulatory Visit: Payer: Self-pay

## 2018-01-15 ENCOUNTER — Encounter: Payer: Self-pay | Admitting: Family Medicine

## 2018-01-15 VITALS — BP 116/82 | HR 81 | Temp 98.4°F | Resp 18 | Ht 68.19 in | Wt 215.1 lb

## 2018-01-15 DIAGNOSIS — G8929 Other chronic pain: Secondary | ICD-10-CM

## 2018-01-15 DIAGNOSIS — L723 Sebaceous cyst: Secondary | ICD-10-CM | POA: Diagnosis not present

## 2018-01-15 DIAGNOSIS — J209 Acute bronchitis, unspecified: Secondary | ICD-10-CM | POA: Diagnosis not present

## 2018-01-15 DIAGNOSIS — I10 Essential (primary) hypertension: Secondary | ICD-10-CM

## 2018-01-15 DIAGNOSIS — M25561 Pain in right knee: Secondary | ICD-10-CM | POA: Diagnosis not present

## 2018-01-15 DIAGNOSIS — E781 Pure hyperglyceridemia: Secondary | ICD-10-CM | POA: Diagnosis not present

## 2018-01-15 DIAGNOSIS — E119 Type 2 diabetes mellitus without complications: Secondary | ICD-10-CM | POA: Diagnosis not present

## 2018-01-15 DIAGNOSIS — F17200 Nicotine dependence, unspecified, uncomplicated: Secondary | ICD-10-CM

## 2018-01-15 MED ORDER — ALBUTEROL SULFATE (2.5 MG/3ML) 0.083% IN NEBU
2.5000 mg | INHALATION_SOLUTION | Freq: Once | RESPIRATORY_TRACT | Status: AC
  Administered 2018-01-15: 2.5 mg via RESPIRATORY_TRACT

## 2018-01-15 MED ORDER — DOXYCYCLINE HYCLATE 100 MG PO TABS: 100.0000 mg | ORAL_TABLET | Freq: Two times a day (BID) | ORAL | 0 refills | Status: DC

## 2018-01-15 MED ORDER — ALBUTEROL SULFATE HFA 108 (90 BASE) MCG/ACT IN AERS: 2.0000 | INHALATION_SPRAY | Freq: Four times a day (QID) | RESPIRATORY_TRACT | 0 refills | Status: DC | PRN

## 2018-01-15 MED ORDER — NAPROXEN 375 MG PO TBEC: 375.0000 mg | DELAYED_RELEASE_TABLET | Freq: Two times a day (BID) | ORAL | 0 refills | Status: DC | PRN

## 2018-01-15 NOTE — Patient Instructions (Addendum)
IF you received an x-ray today, you will receive an invoice from [facility]. Please contact [facility] at [phone] with questions or concerns regarding your invoice.   IF you received labwork today, you will receive an invoice from [place]. Please contact [facility] at [phone] with questions or concerns regarding your invoice.   Our billing staff will not be able to assist you with questions regarding bills from these companies.  You will be contacted with the lab results as soon as they are available. The fastest way to get your results is to activate your My Chart account. Instructions are located on the last page of this paperwork. If you have not heard from [place] regarding the results in 2 weeks, please contact this office.     Epidermal Cyst An epidermal cyst is sometimes called an epidermal inclusion cyst or an infundibular cyst. It is a sac made of skin tissue. The sac contains a substance called keratin. Keratin is a protein that is normally secreted through the hair follicles. When keratin becomes trapped in the top layer of skin (epidermis), it can form an epidermal cyst. Epidermal cysts are usually found on the face, neck, trunk, and genitals. These cysts are usually harmless (benign), and they may not cause symptoms unless they become infected. It is important not to pop epidermal cysts yourself. What are the causes? This condition may be caused by:  A blocked hair follicle.  A hair that curls and re-enters the skin instead of growing straight out of the skin (ingrown hair).  A blocked pore.  Irritated skin.  An injury to the skin.  Certain conditions that are passed along from parent to child (inherited).  Human papillomavirus (HPV).  What increases the risk? The following factors may make you more likely to develop an epidermal cyst:  Having acne.  Being overweight.  Wearing tight clothing.  What are the signs or symptoms? The  only symptom of this condition may be a small, painless lump underneath the skin. When an epidermal cyst becomes infected, symptoms may include:  Redness.  Inflammation.  Tenderness.  Warmth.  Fever.  Keratin draining from the cyst. Keratin may look like a grayish-white, bad-smelling substance.  Pus draining from the cyst.  How is this diagnosed? This condition is diagnosed with a physical exam. In some cases, you may have a sample of tissue (biopsy) taken from your cyst to be examined under a microscope or tested for bacteria. You may be referred to a health care provider who specializes in skin care (dermatologist). How is this treated? In many cases, epidermal cysts go away on their own without treatment. If a cyst becomes infected, treatment may include:  Opening and draining the cyst. After draining, minor surgery to remove the rest of the cyst may be done.  Antibiotic medicine to help prevent infection.  Injections of medicines (steroids) that help to reduce inflammation.  Surgery to remove the cyst. Surgery may be done if: ? The cyst becomes large. ? The cyst bothers you. ? There is a chance that the cyst could turn into cancer.  Follow these instructions at home:  Take over-the-counter and prescription medicines only as told by your health care provider.  If you were prescribed an antibiotic, use it as told by your health care provider. Do not stop using the antibiotic even if you start to feel better.  Keep the area around your cyst clean and dry.  Wear loose, dry clothing.  Do not try  to pop your cyst.  Avoid touching your cyst.  Check your cyst every day for signs of infection.  Keep all follow-up visits as told by your health care provider. This is important. How is this prevented?  Wear clean, dry, clothing.  Avoid wearing tight clothing.  Keep your skin clean and dry. Shower or take baths every day.  Wash your body with a benzoyl peroxide wash  when you shower or bathe. Contact a health care provider if:  Your cyst develops symptoms of infection.  Your condition is not improving or is getting worse.  You develop a cyst that looks different from other cysts you have had.  You have a fever. Get help right away if:  Redness spreads from the cyst into the surrounding area. This information is not intended to replace advice given to you by your health care provider. Make sure you discuss any questions you have with your health care provider. Document Released: 10/25/2004 Document Revised: 07/23/2016 Document Reviewed: 09/26/2015 Elsevier Interactive Patient Education  [employer].

## 2018-01-15 NOTE — Progress Notes (Signed)
2/8/20199:04 AM  Chase Higgins [birth year], 47 y.o. male [id number]  Chief Complaint  Patient presents with  . Mass    X 3-4 year- right knee and back  . Cough    X 2 weeks    HPI:   Patient is a 47 y.o. male with past medical history significant for DM2, HTN and HLD who smokes about 3/4 ppd who presents today for several concerns.  1. Intermittent right knee pain, known OA, was being evaluated by ortho, has gained about 20 lbs, not as active anymore. Has not been taking anything for it.  2. Soft lumps, been there for years, on his back and right leg, does not bother him, no drainage, redness or tenderness  3. 2 weeks of productive cough, had a cold about 3 weeks ago, cold resolved, cough remains, becoming worse, increased sputum, occasionally feels tight and SOB. No fever or chills, no nausea or vomiting. No chest pain. Not interested in quitting smoking.   4. Does not take any medications for DM, last a1c was years ago. Does not check cbgs. Has gained weight.  Depression screen [facility] 2/9 01/15/2018 04/02/2017 03/31/2017  Decreased Interest 0 0 0  Down, Depressed, Hopeless 0 0 0  PHQ - 2 Score 0 0 0    No Known Allergies  Prior to Admission medications   Not on File    Past Medical History:  Diagnosis Date  . Arthritis   . Diabetes mellitus without complication ([place])   . Gout     History reviewed. No pertinent surgical history.  Social History   Tobacco Use  . Smoking status: Current Every Day Smoker    Packs/day: 0.50    Years: 29.00    Pack years: 14.50    Types: Cigarettes  . Smokeless tobacco: Never Used  Substance Use Topics  . Alcohol use: Yes    Family History  Problem Relation Age of Onset  . Hypertension Mother   . Hyperlipidemia Mother   . Diabetes Father   . Stroke Father     ROS Per hpi  OBJECTIVE:  Blood pressure 116/82, pulse 81, temperature 98.4 F (36.9 C), temperature source Oral, resp. rate 18, height 5' 8.19" (1.732 m), weight  215 lb 1.6 oz (97.6 kg), SpO2 96 %.  Physical Exam  Constitutional: He is oriented to person, place, and time and well-developed, well-nourished, and in no distress.  HENT:  Head: Normocephalic and atraumatic.  Right Ear: Hearing, tympanic membrane, external ear and ear canal normal.  Left Ear: Hearing, tympanic membrane, external ear and ear canal normal.  Mouth/Throat: Oropharynx is clear and moist. No oropharyngeal exudate.  Eyes: Conjunctivae and EOM are normal. Pupils are equal, round, and reactive to light.  Neck: Neck supple.  Cardiovascular: Normal rate and regular rhythm. Exam reveals no gallop and no friction rub.  No murmur heard. Pulmonary/Chest: Effort normal. He has wheezes. He has no rales.  Musculoskeletal:       Right knee: He exhibits bony tenderness. He exhibits normal range of motion, no swelling, no effusion, no deformity and no erythema.  Lymphadenopathy:    He has no cervical adenopathy.  Neurological: He is alert and oriented to person, place, and time. Gait normal.  Skin: Skin is warm and dry.        ASSESSMENT and PLAN  1. Chronic pain of right knee Known OA, no trauma, recent weight gain, discussed conservative measures, consider ortho referral - Naproxen 375 MG TBEC; Take 1 tablet (375  mg total) by mouth 2 (two) times daily as needed.  2. Hypertriglyceridemia - Comprehensive metabolic panel - TSH - Lipid panel  3. Essential hypertension, benign - CBC - Comprehensive metabolic panel - TSH  4. Type 2 diabetes mellitus without complication, without long-term current use of insulin (HCC) - Comprehensive metabolic panel - TSH - Hemoglobin A1c - Microalbumin/Creatinine Ratio, Urine  5. Acute bronchitis, unspecified organism Discussed supportive measures, new meds r/se/b and RTC precautions. Patient educational handout given. - albuterol (PROVENTIL) (2.5 MG/3ML) 0.083% nebulizer solution 2.5 mg - albuterol (PROVENTIL HFA;VENTOLIN HFA) 108 (90  Base) MCG/ACT inhaler; Inhale 2 puffs into the lungs every 6 (six) hours as needed for wheezing or shortness of breath. - doxycycline (VIBRA-TABS) 100 MG tablet; Take 1 tablet (100 mg total) by mouth 2 (two) times daily.   6. Tobacco use disorder Pre-contemplative.  7. Sebaceous cyst Discussed benign, not infected, not bothering patient, no need for removal at this time   Return in about 2 weeks (around 01/29/2018) for DM. HLP.    Rutherford Guys, MD Primary Care at [address], [place] [address] Ph.  [phone] Fax [phone]

## 2018-01-16 LAB — CBC
Hematocrit: 47.4 % (ref 37.5–51.0)
Hemoglobin: 16.5 g/dL (ref 13.0–17.7)
MCH: 30.6 pg (ref 26.6–33.0)
MCHC: 34.8 g/dL (ref 31.5–35.7)
MCV: 88 fL (ref 79–97)
Platelets: 262 10*3/uL (ref 150–379)
RBC: 5.39 x10E6/uL (ref 4.14–5.80)
RDW: 14.3 % (ref 12.3–15.4)
WBC: 9.5 10*3/uL (ref 3.4–10.8)

## 2018-01-16 LAB — COMPREHENSIVE METABOLIC PANEL
ALT: 23 IU/L (ref 0–44)
AST: 13 IU/L (ref 0–40)
Albumin/Globulin Ratio: 1.5 (ref 1.2–2.2)
Albumin: 4.3 g/dL (ref 3.5–5.5)
Alkaline Phosphatase: 75 IU/L (ref 39–117)
BUN/Creatinine Ratio: 14 (ref 9–20)
BUN: 12 mg/dL (ref 6–24)
Bilirubin Total: 0.3 mg/dL (ref 0.0–1.2)
CO2: 13 mmol/L — CL (ref 20–29)
Calcium: 9.1 mg/dL (ref 8.7–10.2)
Chloride: 103 mmol/L (ref 96–106)
Creatinine, Ser: 0.85 mg/dL (ref 0.76–1.27)
GFR calc Af Amer: 118 mL/min/{1.73_m2} (ref >59)
GFR calc non Af Amer: 102 mL/min/{1.73_m2} (ref >59)
Globulin, Total: 2.8 g/dL (ref 1.5–4.5)
Glucose: 121 mg/dL — ABNORMAL HIGH (ref 65–99)
Potassium: 4.5 mmol/L (ref 3.5–5.2)
Sodium: 140 mmol/L (ref 134–144)
Total Protein: 7.1 g/dL (ref 6.0–8.5)

## 2018-01-16 LAB — LIPID PANEL
Chol/HDL Ratio: 8.6 ratio — ABNORMAL HIGH (ref 0.0–5.0)
Cholesterol, Total: 242 mg/dL — ABNORMAL HIGH (ref 100–199)
HDL: 28 mg/dL — ABNORMAL LOW (ref >39)
Triglycerides: 1004 mg/dL (ref 0–149)

## 2018-01-16 LAB — MICROALBUMIN / CREATININE URINE RATIO
Creatinine, Urine: 129.4 mg/dL
Microalb/Creat Ratio: <2.3 mg/g creat (ref 0.0–30.0)
Microalbumin, Urine: <3 ug/mL

## 2018-01-16 LAB — TSH: TSH: 2.27 u[IU]/mL (ref 0.450–4.500)

## 2018-01-16 LAB — HEMOGLOBIN A1C
Est. average glucose Bld gHb Est-mCnc: 131 mg/dL
Hgb A1c MFr Bld: 6.2 % — ABNORMAL HIGH (ref 4.8–5.6)

## 2018-01-17 ENCOUNTER — Other Ambulatory Visit: Payer: Self-pay | Admitting: Family Medicine

## 2018-01-17 MED ORDER — FENOFIBRATE 145 MG PO TABS: 145.0000 mg | ORAL_TABLET | Freq: Every day | ORAL | 5 refills | Status: DC

## 2018-01-28 ENCOUNTER — Ambulatory Visit: Payer: BLUE CROSS/BLUE SHIELD | Admitting: Family Medicine

## 2018-01-28 ENCOUNTER — Encounter: Payer: Self-pay | Admitting: Family Medicine

## 2018-01-28 ENCOUNTER — Other Ambulatory Visit: Payer: Self-pay

## 2018-01-28 VITALS — BP 140/82 | HR 90 | Temp 98.5°F | Ht 68.5 in | Wt 216.0 lb

## 2018-01-28 DIAGNOSIS — E119 Type 2 diabetes mellitus without complications: Secondary | ICD-10-CM | POA: Diagnosis not present

## 2018-01-28 DIAGNOSIS — M1180 Other specified crystal arthropathies, unspecified site: Secondary | ICD-10-CM | POA: Diagnosis not present

## 2018-01-28 DIAGNOSIS — E781 Pure hyperglyceridemia: Secondary | ICD-10-CM | POA: Diagnosis not present

## 2018-01-28 NOTE — Patient Instructions (Addendum)
1. Start over the counter fish oil, 1000mg  EPA a day 2. Flax seed added to meals 3. Recheck lipids in 3 months, FASTING, lab visit only 4. Followup in 6 months 5. Please see eye doctor of choice for routine eye care, including complete retina exam given diabetes    IF you received an x-ray today, you will receive an invoice from [facility]. Please contact [facility] at [phone] with questions or concerns regarding your invoice.   IF you received labwork today, you will receive an invoice from [facility]. Please contact [facility] at [phone] with questions or concerns regarding your invoice.   Our billing staff will not be able to assist you with questions regarding bills from these companies.  You will be contacted with the lab results as soon as they are available. The fastest way to get your results is to activate your My Chart account. Instructions are located on the last page of this paperwork. If you have not heard from [place] regarding the results in 2 weeks, please contact this office.    Food Choices to Lower Your Triglycerides Triglycerides are a type of fat in your blood. High levels of triglycerides can increase the risk of heart disease and stroke. If your triglyceride levels are high, the foods you eat and your eating habits are very important. Choosing the right foods can help lower your triglycerides. What general guidelines do I need to follow?  Lose weight if you are overweight.  Limit or avoid alcohol.  Fill one half of your plate with vegetables and green salads.  Limit fruit to two servings a day. Choose fruit instead of juice.  Make one fourth of your plate whole grains. Look for the word "whole" as the first word in the ingredient list.  Fill one fourth of your plate with lean protein foods.  Enjoy fatty fish (such as salmon, mackerel, sardines, and tuna) three times a week.  Choose healthy fats.  Limit foods high in starch and  sugar.  Eat more home-cooked food and less restaurant, buffet, and fast food.  Limit fried foods.  Cook foods using methods other than frying.  Limit saturated fats.  Check ingredient lists to avoid foods with partially hydrogenated oils (trans fats) in them. What foods can I eat? Grains Whole grains, such as whole wheat or whole grain breads, crackers, cereals, and pasta. Unsweetened oatmeal, bulgur, barley, quinoa, or brown rice. Corn or whole wheat flour tortillas. Vegetables Fresh or frozen vegetables (raw, steamed, roasted, or grilled). Green salads. Fruits All fresh, canned (in natural juice), or frozen fruits. Meat and Other Protein Products Ground beef (85% or leaner), grass-fed beef, or beef trimmed of fat. Skinless chicken or [place]. Ground chicken or [place]. Pork trimmed of fat. All fish and seafood. Eggs. Dried beans, peas, or lentils. Unsalted nuts or seeds. Unsalted canned or dry beans. Dairy Low-fat dairy products, such as skim or 1% milk, 2% or reduced-fat cheeses, low-fat ricotta or cottage cheese, or plain low-fat yogurt. Fats and Oils Tub margarines without trans fats. Light or reduced-fat mayonnaise and salad dressings. Avocado. Safflower, olive, or canola oils. Natural peanut or almond butter. The items listed above may not be a complete list of recommended foods or beverages. Contact your dietitian for more options. What foods are not recommended? Grains White bread. White pasta. White rice. Cornbread. Bagels, pastries, and croissants. Crackers that contain trans fat. Vegetables White potatoes. Corn. Creamed or fried vegetables. Vegetables in a cheese sauce. Fruits Dried fruits. Canned fruit  in light or heavy syrup. Fruit juice. Meat and Other Protein Products Fatty cuts of meat. Ribs, chicken wings, bacon, sausage, bologna, salami, chitterlings, fatback, hot dogs, bratwurst, and packaged luncheon meats. Dairy Whole or 2% milk, cream, half-and-half, and cream  cheese. Whole-fat or sweetened yogurt. Full-fat cheeses. Nondairy creamers and whipped toppings. Processed cheese, cheese spreads, or cheese curds. Sweets and Desserts Corn syrup, sugars, honey, and molasses. Candy. Jam and jelly. Syrup. Sweetened cereals. Cookies, pies, cakes, donuts, muffins, and ice cream. Fats and Oils Butter, stick margarine, lard, shortening, ghee, or bacon fat. Coconut, palm kernel, or palm oils. Beverages Alcohol. Sweetened drinks (such as sodas, lemonade, and fruit drinks or punches). The items listed above may not be a complete list of foods and beverages to avoid. Contact your dietitian for more information. This information is not intended to replace advice given to you by your health care provider. Make sure you discuss any questions you have with your health care provider. Document Released: 09/11/2004 Document Revised: 05/01/2016 Document Reviewed: 09/28/2013 Elsevier Interactive Patient Education  2017 [employer].

## 2018-01-28 NOTE — Progress Notes (Signed)
2/21/201910:07 AM  Chase Higgins [birth year], 47 y.o. male [id number]  Chief Complaint  Patient presents with  . Follow-up    say no more pain in the right knee. Also here for diabetes chk    HPI:   Patient is a 47 y.o. male with past medical history significant for elevated TG, diabetes diet controlled and gout who presents today for follow-up  Gout flareup resolved TG > 1000, Has started fenofibrate, tolerating well, no concerns Denies any numbness, tingling or pain in feet Has not seen eye doctor in over a year, wears glasses, denies any vision changes. Has no acute concerns today  Depression screen [facility] 2/9 01/28/2018 01/15/2018 04/02/2017  Decreased Interest 0 0 0  Down, Depressed, Hopeless 0 0 0  PHQ - 2 Score 0 0 0    No Known Allergies  Prior to Admission medications   Medication Sig Start Date End Date Taking? Authorizing Provider  fenofibrate (TRICOR) 145 MG tablet Take 1 tablet (145 mg total) by mouth daily. 01/17/18  Yes Rutherford Guys, MD    Past Medical History:  Diagnosis Date  . Arthritis   . Diabetes mellitus without complication ([place])   . Gout     History reviewed. No pertinent surgical history.  Social History   Tobacco Use  . Smoking status: Current Every Day Smoker    Packs/day: 0.50    Years: 29.00    Pack years: 14.50    Types: Cigarettes  . Smokeless tobacco: Never Used  Substance Use Topics  . Alcohol use: Yes    Family History  Problem Relation Age of Onset  . Hypertension Mother   . Hyperlipidemia Mother   . Diabetes Father   . Stroke Father     ROS Per hpi  OBJECTIVE:  Blood pressure 140/82, pulse 90, temperature 98.5 F (36.9 C), temperature source Oral, height 5' 8.5" (1.74 m), weight 216 lb (98 kg), SpO2 97 %.   Visual Acuity Screening   Right eye Left eye Both eyes  Without correction: 20/50 20/25 20/25   With correction:       Physical Exam  Constitutional: He is oriented to person, place, and time and  well-developed, well-nourished, and in no distress.  HENT:  Head: Normocephalic and atraumatic.  Mouth/Throat: Oropharynx is clear and moist.  Eyes: EOM are normal. Pupils are equal, round, and reactive to light.  Neck: Neck supple.  Pulmonary/Chest: Effort normal.  Neurological: He is alert and oriented to person, place, and time. Gait normal.  Skin: Skin is warm and dry.  Psychiatric: Mood and affect normal.  Nursing note and vitals reviewed.      Diabetic Foot Exam - Simple   Simple Foot Form Visual Inspection No deformities, no ulcerations, no other skin breakdown bilaterally:  Yes Sensation Testing Pulse Check Comments All pricks felt on right and left foet     ASSESSMENT and PLAN  1. Hypertriglyceridemia Cont fenofibrate, add OTC fish oil, discussed LFM, recheck in 3 months, RTC precautions given - Lipid panel; Future  2. Type 2 diabetes mellitus without complication, without long-term current use of insulin (HCC) Diet controlled, advised yearly eye exam with ophtho of his choice - Comprehensive metabolic panel - Urinalysis, dipstick only  3. Calcium pyrophosphate arthropathy Flare up resolved, discussed gout diet  Return in about 6 months (around 07/28/2018) for diabetes and lipid follow-up.    Rutherford Guys, MD Primary Care at [address], [place] [address] Ph.  [phone] Fax [phone]

## 2018-01-29 LAB — COMPREHENSIVE METABOLIC PANEL
ALT: 24 IU/L (ref 0–44)
AST: 14 IU/L (ref 0–40)
Albumin/Globulin Ratio: 1.6 (ref 1.2–2.2)
Albumin: 4.4 g/dL (ref 3.5–5.5)
Alkaline Phosphatase: 68 IU/L (ref 39–117)
BUN/Creatinine Ratio: 6 — ABNORMAL LOW (ref 9–20)
BUN: 7 mg/dL (ref 6–24)
Bilirubin Total: 0.4 mg/dL (ref 0.0–1.2)
CO2: 21 mmol/L (ref 20–29)
Calcium: 9.2 mg/dL (ref 8.7–10.2)
Chloride: 101 mmol/L (ref 96–106)
Creatinine, Ser: 1.17 mg/dL (ref 0.76–1.27)
GFR calc Af Amer: 84 mL/min/{1.73_m2} (ref >59)
GFR calc non Af Amer: 73 mL/min/{1.73_m2} (ref >59)
Globulin, Total: 2.7 g/dL (ref 1.5–4.5)
Glucose: 106 mg/dL — ABNORMAL HIGH (ref 65–99)
Potassium: 4.1 mmol/L (ref 3.5–5.2)
Sodium: 138 mmol/L (ref 134–144)
Total Protein: 7.1 g/dL (ref 6.0–8.5)

## 2018-01-29 LAB — URINALYSIS, DIPSTICK ONLY
Bilirubin, UA: NEGATIVE
Glucose, UA: NEGATIVE
Ketones, UA: NEGATIVE
Leukocytes, UA: NEGATIVE
Nitrite, UA: NEGATIVE
Protein, UA: NEGATIVE
RBC, UA: NEGATIVE
Specific Gravity, UA: 1.022 (ref 1.005–1.030)
Urobilinogen, Ur: 0.2 mg/dL (ref 0.2–1.0)
pH, UA: 5 (ref 5.0–7.5)

## 2018-02-04 ENCOUNTER — Encounter: Payer: Self-pay | Admitting: Family Medicine

## 2018-03-10 ENCOUNTER — Encounter: Payer: Self-pay | Admitting: Physician Assistant

## 2018-07-07 ENCOUNTER — Other Ambulatory Visit: Payer: Self-pay

## 2018-07-07 ENCOUNTER — Encounter (HOSPITAL_COMMUNITY): Payer: Self-pay

## 2018-07-07 ENCOUNTER — Emergency Department (HOSPITAL_COMMUNITY)
Admission: EM | Admit: 2018-07-07 | Discharge: 2018-07-07 | Disposition: A | Discharge status: Home / Self Care | Payer: BLUE CROSS/BLUE SHIELD | Attending: Emergency Medicine | Admitting: Emergency Medicine

## 2018-07-07 DIAGNOSIS — Z79899 Other long term (current) drug therapy: Secondary | ICD-10-CM | POA: Diagnosis not present

## 2018-07-07 DIAGNOSIS — I10 Essential (primary) hypertension: Secondary | ICD-10-CM | POA: Diagnosis not present

## 2018-07-07 DIAGNOSIS — E119 Type 2 diabetes mellitus without complications: Secondary | ICD-10-CM | POA: Insufficient documentation

## 2018-07-07 DIAGNOSIS — M25562 Pain in left knee: Secondary | ICD-10-CM | POA: Diagnosis present

## 2018-07-07 DIAGNOSIS — F17210 Nicotine dependence, cigarettes, uncomplicated: Secondary | ICD-10-CM | POA: Insufficient documentation

## 2018-07-07 DIAGNOSIS — M109 Gout, unspecified: Secondary | ICD-10-CM | POA: Insufficient documentation

## 2018-07-07 LAB — SYNOVIAL CELL COUNT + DIFF, W/ CRYSTALS
Eosinophils-Synovial: 0 % (ref 0–1)
Lymphocytes-Synovial Fld: 2 % (ref 0–20)
Monocyte-Macrophage-Synovial Fluid: 19 % — ABNORMAL LOW (ref 50–90)
Neutrophil, Synovial: 79 % — ABNORMAL HIGH (ref 0–25)
WBC, Synovial: 11200 /mm3 — ABNORMAL HIGH (ref 0–200)

## 2018-07-07 MED ORDER — METHYLPREDNISOLONE ACETATE 40 MG/ML IJ SUSP
40.0000 mg | Freq: Once | INTRAMUSCULAR | Status: AC
  Administered 2018-07-07: 40 mg via INTRA_ARTICULAR
  Filled 2018-07-07: qty 1

## 2018-07-07 MED ORDER — OXYCODONE-ACETAMINOPHEN 5-325 MG PO TABS
1.0000 | ORAL_TABLET | Freq: Once | ORAL | Status: AC
  Administered 2018-07-07: 1 via ORAL
  Filled 2018-07-07: qty 1

## 2018-07-07 MED ORDER — IBUPROFEN 800 MG PO TABS: 800.0000 mg | ORAL_TABLET | Freq: Three times a day (TID) | ORAL | 0 refills | Status: DC

## 2018-07-07 MED ORDER — COLCHICINE 0.6 MG PO TABS
0.6000 mg | ORAL_TABLET | Freq: Once | ORAL | Status: AC
  Administered 2018-07-07: 0.6 mg via ORAL
  Filled 2018-07-07: qty 1

## 2018-07-07 MED ORDER — IBUPROFEN 800 MG PO TABS
800.0000 mg | ORAL_TABLET | Freq: Once | ORAL | Status: AC
  Administered 2018-07-07: 800 mg via ORAL
  Filled 2018-07-07: qty 1

## 2018-07-07 MED ORDER — COLCHICINE 0.6 MG PO TABS: 0.6000 mg | ORAL_TABLET | Freq: Two times a day (BID) | ORAL | 3 refills | Status: DC

## 2018-07-07 MED ORDER — LIDOCAINE HCL 2 % IJ SOLN
10.0000 mL | Freq: Once | INTRAMUSCULAR | Status: AC
  Administered 2018-07-07: 200 mg
  Filled 2018-07-07: qty 20

## 2018-07-07 MED ORDER — BUPIVACAINE HCL (PF) 0.5 % IJ SOLN
10.0000 mL | Freq: Once | INTRAMUSCULAR | Status: AC
  Administered 2018-07-07: 10 mL via INTRA_ARTICULAR
  Filled 2018-07-07: qty 10

## 2018-07-07 MED ORDER — OXYCODONE-ACETAMINOPHEN 5-325 MG PO TABS: 1.0000 | ORAL_TABLET | ORAL | 0 refills | Status: DC | PRN

## 2018-07-07 NOTE — ED Triage Notes (Signed)
Pt states that he his having a GOUT flare up in his left knee since Saturday

## 2018-07-07 NOTE — ED Provider Notes (Signed)
[facility] EMERGENCY DEPARTMENT Provider Note   CSN: [id number] Arrival date & time: 07/07/18  0107     History   Chief Complaint Chief Complaint  Patient presents with  . Gout    HPI Chase Higgins is a 47 y.o. male.  Patient presents to the emergency department for evaluation of knee pain.  Patient reports that symptoms began 3 days ago.  He reports progressive, now severe pain in the knee.  He is able to bend the knee, but with pain.  The most comfortable position is sitting with the knee flexed, when he stands, pain increases.  He denies any direct trauma.  Patient has had multiple episodes of gout in both of his knees, reports that this is exactly identical to previous episodes.     Past Medical History:  Diagnosis Date  . Arthritis   . Diabetes mellitus without complication ([place])   . Gout     Patient Active Problem List   Diagnosis Date Noted  . Gout attack 04/10/2014  . Calcium pyrophosphate arthropathy 02/01/2014  . Diabetes mellitus, type 2 ([place]) 01/31/2014  . Essential hypertension, benign 01/31/2014  . Hypertriglyceridemia 01/31/2014    History reviewed. No pertinent surgical history.      Home Medications    Prior to Admission medications   Medication Sig Start Date End Date Taking? Authorizing Provider  colchicine 0.6 MG tablet Take 1 tablet (0.6 mg total) by mouth 2 (two) times daily. 07/07/18   Orpah Greek, MD  fenofibrate (TRICOR) 145 MG tablet Take 1 tablet (145 mg total) by mouth daily. 01/17/18   Rutherford Guys, MD  ibuprofen (ADVIL,MOTRIN) 800 MG tablet Take 1 tablet (800 mg total) by mouth 3 (three) times daily. 07/07/18   Orpah Greek, MD  oxyCODONE-acetaminophen (PERCOCET) 5-325 MG tablet Take 1-2 tablets by mouth every 4 (four) hours as needed. 07/07/18   Orpah Greek, MD    Family History Family History  Problem Relation Age of Onset  . Hypertension Mother   . Hyperlipidemia Mother   .  Diabetes Father   . Stroke Father     Social History Social History   Tobacco Use  . Smoking status: Current Every Day Smoker    Packs/day: 0.50    Years: 29.00    Pack years: 14.50    Types: Cigarettes  . Smokeless tobacco: Never Used  Substance Use Topics  . Alcohol use: Yes  . Drug use: Yes    Types: Cocaine, Marijuana    Comment: Hx per record     Allergies   Patient has no known allergies.   Review of Systems Review of Systems  Musculoskeletal: Positive for arthralgias.  All other systems reviewed and are negative.    Physical Exam Updated Vital Signs BP (!) 122/92   Pulse 68   Temp 98.3 F (36.8 C) (Oral)   Resp 18   Ht 5\' 7"  (1.702 m)   Wt 95.3 kg (210 lb)   SpO2 100%   BMI 32.89 kg/m   Physical Exam  Constitutional: He is oriented to person, place, and time. He appears well-developed and well-nourished. No distress.  HENT:  Head: Normocephalic and atraumatic.  Right Ear: Hearing normal.  Left Ear: Hearing normal.  Nose: Nose normal.  Mouth/Throat: Oropharynx is clear and moist and mucous membranes are normal.  Eyes: Pupils are equal, round, and reactive to light. Conjunctivae and EOM are normal.  Neck: Normal range of motion. Neck supple.  Cardiovascular: Regular  rhythm, S1 normal and S2 normal. Exam reveals no gallop and no friction rub.  No murmur heard. Pulmonary/Chest: Effort normal and breath sounds normal. No respiratory distress. He exhibits no tenderness.  Abdominal: Soft. Normal appearance and bowel sounds are normal. There is no hepatosplenomegaly. There is no tenderness. There is no rebound, no guarding, no tenderness at McBurney's point and negative Murphy's sign. No hernia.  Musculoskeletal: Normal range of motion.       Left knee: He exhibits effusion. He exhibits normal range of motion, no ecchymosis, no deformity, no laceration and no erythema. Tenderness found.  Neurological: He is alert and oriented to person, place, and time. He  has normal strength. No cranial nerve deficit or sensory deficit. Coordination normal. GCS eye subscore is 4. GCS verbal subscore is 5. GCS motor subscore is 6.  Skin: Skin is warm, dry and intact. No rash noted. No cyanosis.  Psychiatric: He has a normal mood and affect. His speech is normal and behavior is normal. Thought content normal.  Nursing note and vitals reviewed.    ED Treatments / Results  Labs (all labs ordered are listed, but only abnormal results are displayed) Labs Reviewed  SYNOVIAL CELL COUNT + DIFF, W/ CRYSTALS - Abnormal; Notable for the following components:      Result Value   Color, Synovial YELLOW (*)    Appearance-Synovial TURBID (*)    WBC, Synovial 11,200 (*)    Neutrophil, Synovial 79 (*)    Monocyte-Macrophage-Synovial Fluid 19 (*)    All other components within normal limits  BODY FLUID CULTURE    EKG None  Radiology No results found.  Procedures .Joint Aspiration/Arthrocentesis Date/Time: 07/07/2018 3:32 AM Performed by: Orpah Greek, MD Authorized by: Orpah Greek, MD   Consent:    Consent obtained:  Verbal   Consent given by:  Patient   Risks discussed:  Bleeding, infection and pain Universal protocol:    Procedure explained and questions answered to patient or proxy's satisfaction: yes     Site/side marked: yes     Immediately prior to procedure, a time out was called: yes     Patient identity confirmed:  Verbally with patient Location:    Location:  Knee   Knee:  L knee Anesthesia (see MAR for exact dosages):    Anesthesia method:  Local infiltration   Local anesthetic:  Lidocaine 2% w/o epi Procedure details:    Preparation: Patient was prepped and draped in usual sterile fashion     Needle gauge:  18 G   Ultrasound guidance: no     Approach:  Medial   Aspirate amount:  20ml   Aspirate characteristics:  Yellow and serous   Steroid injected: yes (40mg  depomedrol, 45ml 0.5% bupivocaine, 80ml 2% lidocaine)      Specimen collected: yes   Post-procedure details:    Dressing:  Adhesive bandage   Patient tolerance of procedure:  Tolerated well, no immediate complications   (including critical care time)  Medications Ordered in ED Medications  methylPREDNISolone acetate (DEPO-MEDROL) injection 40 mg (40 mg Intra-articular Given 07/07/18 0312)  bupivacaine (MARCAINE) 0.5 % injection 10 mL (10 mLs Intra-articular Given 07/07/18 0313)  lidocaine (XYLOCAINE) 2 % (with pres) injection 200 mg (200 mg Other Given 07/07/18 0312)  colchicine tablet 0.6 mg (0.6 mg Oral Given 07/07/18 0343)  ibuprofen (ADVIL,MOTRIN) tablet 800 mg (800 mg Oral Given 07/07/18 0413)  oxyCODONE-acetaminophen (PERCOCET/ROXICET) 5-325 MG per tablet 1 tablet (1 tablet Oral Given 07/07/18 0413)  Initial Impression / Assessment and Plan / ED Course  I have reviewed the triage vital signs and the nursing notes.  Pertinent labs & imaging results that were available during my care of the patient were reviewed by me and considered in my medical decision making (see chart for details).     Patient presents with pain and swelling of the left knee for 3 days.  He has had intermittent episodes of gout in both of his knees with similar presentations and symptoms.  Examination reveals painful range of motion, but range of motion is preserved.  No warmth or erythema of the knee, extremely low suspicion for infection.  Presentation was very consistent with his known history of gout.  I recommended joint aspiration and injection.  This was performed without difficulty as outlined above.  A small amount of serous fluid was obtained, intra-articular crystals were confirmed.  Procedure was performed with a three-way stopcock, after aspiration, joint was injected with bupivacaine, lidocaine, Depo-Medrol.  Patient has had significant pain relief.  Final Clinical Impressions(s) / ED Diagnoses   Final diagnoses:  Acute gout of left knee, unspecified cause     ED Discharge Orders        Ordered    oxyCODONE-acetaminophen (PERCOCET) 5-325 MG tablet  Every 4 hours PRN     07/07/18 0511    colchicine 0.6 MG tablet  2 times daily,   Status:  Discontinued     07/07/18 0511    ibuprofen (ADVIL,MOTRIN) 800 MG tablet  3 times daily,   Status:  Discontinued     07/07/18 0511    colchicine 0.6 MG tablet  2 times daily     07/07/18 0511    ibuprofen (ADVIL,MOTRIN) 800 MG tablet  3 times daily     07/07/18 0511       Orpah Greek, MD 07/07/18 [phone]

## 2018-07-10 LAB — BODY FLUID CULTURE: Culture: NO GROWTH

## 2018-11-10 ENCOUNTER — Other Ambulatory Visit: Payer: Self-pay

## 2018-11-10 ENCOUNTER — Encounter: Payer: Self-pay | Admitting: Emergency Medicine

## 2018-11-10 ENCOUNTER — Ambulatory Visit: Payer: BLUE CROSS/BLUE SHIELD | Admitting: Emergency Medicine

## 2018-11-10 VITALS — BP 107/70 | HR 110 | Temp 98.2°F | Resp 16 | Ht 67.0 in | Wt 211.8 lb

## 2018-11-10 DIAGNOSIS — R05 Cough: Secondary | ICD-10-CM | POA: Diagnosis not present

## 2018-11-10 DIAGNOSIS — R059 Cough, unspecified: Secondary | ICD-10-CM

## 2018-11-10 DIAGNOSIS — J22 Unspecified acute lower respiratory infection: Secondary | ICD-10-CM | POA: Diagnosis not present

## 2018-11-10 DIAGNOSIS — R0989 Other specified symptoms and signs involving the circulatory and respiratory systems: Secondary | ICD-10-CM | POA: Diagnosis not present

## 2018-11-10 DIAGNOSIS — F17200 Nicotine dependence, unspecified, uncomplicated: Secondary | ICD-10-CM | POA: Diagnosis not present

## 2018-11-10 MED ORDER — BENZONATATE 200 MG PO CAPS: 200.0000 mg | ORAL_CAPSULE | Freq: Two times a day (BID) | ORAL | 0 refills | Status: DC | PRN

## 2018-11-10 MED ORDER — PROMETHAZINE-CODEINE 6.25-10 MG/5ML PO SYRP
5.0000 mL | ORAL_SOLUTION | Freq: Every evening | ORAL | 0 refills | Status: DC | PRN
Start: 2018-11-10 — End: 2024-04-06

## 2018-11-10 MED ORDER — AZITHROMYCIN 250 MG PO TABS: ORAL_TABLET | ORAL | 0 refills | Status: DC

## 2018-11-10 MED ORDER — PSEUDOEPHEDRINE-GUAIFENESIN ER 60-600 MG PO TB12: 1.0000 | ORAL_TABLET | Freq: Two times a day (BID) | ORAL | 1 refills | Status: AC

## 2018-11-10 NOTE — Progress Notes (Signed)
Chase Higgins 47 y.o.   Chief Complaint  Patient presents with  . Cough    per patient x 3 days  . Nasal Congestion    HISTORY OF PRESENT ILLNESS: This is a 47 y.o. male complaining of productive cough, runny nose, sore throat, watery nonbloody diarrhea that started 3 days ago.  Smoker. Denies any other significant symptoms.  Cough  This is a new problem. The current episode started in the past 7 days. The problem has been gradually worsening. The problem occurs every few minutes. The cough is productive of sputum. Associated symptoms include nasal congestion, rhinorrhea and a sore throat. Pertinent negatives include no chest pain, chills, ear congestion, ear pain, fever, headaches, heartburn, hemoptysis, myalgias, rash, shortness of breath, sweats, weight loss or wheezing. Nothing aggravates the symptoms. Risk factors for lung disease include smoking/tobacco exposure. He has tried OTC cough suppressant (DayQuil) for the symptoms. The treatment provided no relief. There is no history of asthma, COPD, emphysema, environmental allergies or pneumonia.     Prior to Admission medications   Medication Sig Start Date End Date Taking? Authorizing Provider  Chlorphen-Pseudoephed-APAP [facility] FLU/COLD PO) Take by mouth 2 (two) times daily.   Yes [provider]  colchicine 0.6 MG tablet Take 1 tablet (0.6 mg total) by mouth 2 (two) times daily. Patient not taking: Reported on 11/10/2018 07/07/18   Orpah Greek, MD  fenofibrate (TRICOR) 145 MG tablet Take 1 tablet (145 mg total) by mouth daily. Patient not taking: Reported on 11/10/2018 01/17/18   Rutherford Guys, MD  ibuprofen (ADVIL,MOTRIN) 800 MG tablet Take 1 tablet (800 mg total) by mouth 3 (three) times daily. Patient not taking: Reported on 11/10/2018 07/07/18   Orpah Greek, MD  oxyCODONE-acetaminophen (PERCOCET) 5-325 MG tablet Take 1-2 tablets by mouth every 4 (four) hours as needed. Patient not taking:  Reported on 11/10/2018 07/07/18   Orpah Greek, MD    Not on File  Patient Active Problem List   Diagnosis Date Noted  . Gout attack 04/10/2014  . Calcium pyrophosphate arthropathy 02/01/2014  . Diabetes mellitus, type 2 ([place]) 01/31/2014  . Essential hypertension, benign 01/31/2014  . Hypertriglyceridemia 01/31/2014    Past Medical History:  Diagnosis Date  . Arthritis   . Diabetes mellitus without complication ([place])   . Gout     No past surgical history on file.  Social History   Socioeconomic History  . Marital status: Married    Spouse name: Soon MI  . Number of children: 2  . Years of education: 31  . Highest education level: Not on file  Occupational History  . Not on file  Social Needs  . Financial resource strain: Not on file  . Food insecurity:    Worry: Not on file    Inability: Not on file  . Transportation needs:    Medical: Not on file    Non-medical: Not on file  Tobacco Use  . Smoking status: Current Every Day Smoker    Packs/day: 0.50    Years: 29.00    Pack years: 14.50    Types: Cigarettes  . Smokeless tobacco: Never Used  Substance and Sexual Activity  . Alcohol use: Yes  . Drug use: Yes    Types: Cocaine, Marijuana    Comment: Hx per record  . Sexual activity: Not Currently  Lifestyle  . Physical activity:    Days per week: Not on file    Minutes per session: Not on file  .  Stress: Not on file  Relationships  . Social connections:    Talks on phone: Not on file    Gets together: Not on file    Attends religious service: Not on file    Active member of club or organization: Not on file    Attends meetings of clubs or organizations: Not on file    Relationship status: Not on file  . Intimate partner violence:    Fear of current or ex partner: Not on file    Emotionally abused: Not on file    Physically abused: Not on file    Forced sexual activity: Not on file  Other Topics Concern  . Not on file  Social History  Narrative  . Not on file    Family History  Problem Relation Age of Onset  . Hypertension Mother   . Hyperlipidemia Mother   . Diabetes Father   . Stroke Father      Review of Systems  Constitutional: Negative.  Negative for chills, fever and weight loss.  HENT: Positive for rhinorrhea and sore throat. Negative for congestion, ear pain and nosebleeds.   Eyes: Negative.   Respiratory: Positive for cough and sputum production. Negative for hemoptysis, shortness of breath and wheezing.   Cardiovascular: Negative.  Negative for chest pain, palpitations and leg swelling.  Gastrointestinal: Positive for diarrhea. Negative for abdominal pain, heartburn and vomiting.  Genitourinary: Negative.   Musculoskeletal: Negative for back pain, joint pain, myalgias and neck pain.  Skin: Negative.  Negative for rash.  Neurological: Negative.  Negative for dizziness and headaches.  Endo/Heme/Allergies: Negative.  Negative for environmental allergies.  All other systems reviewed and are negative.  Vitals:   11/10/18 0936  BP: 107/70  Pulse: (!) 110  Resp: 16  Temp: 98.2 F (36.8 C)  SpO2: 96%     Physical Exam  Constitutional: He is oriented to person, place, and time. He appears well-developed and well-nourished.  HENT:  Head: Normocephalic and atraumatic.  Right Ear: External ear normal.  Left Ear: External ear normal.  Nose: Nose normal.  Mouth/Throat: Oropharynx is clear and moist.  Eyes: Pupils are equal, round, and reactive to light. Conjunctivae and EOM are normal.  Neck: Normal range of motion. Neck supple. No thyromegaly present.  Cardiovascular: Normal rate, regular rhythm and normal heart sounds.  Pulmonary/Chest: Effort normal and breath sounds normal.  Abdominal: Soft. He exhibits no distension. There is no tenderness.  Musculoskeletal: Normal range of motion.  Lymphadenopathy:    He has no cervical adenopathy.  Neurological: He is alert and oriented to person, place,  and time. No sensory deficit. He exhibits normal muscle tone. Coordination normal.  Skin: Skin is warm and dry. Capillary refill takes less than 2 seconds.  Psychiatric: He has a normal mood and affect. His behavior is normal.  Vitals reviewed.    ASSESSMENT & PLAN: Chase was seen today for cough and nasal congestion.  Diagnoses and all orders for this visit:  Cough -     benzonatate (TESSALON) 200 MG capsule; Take 1 capsule (200 mg total) by mouth 2 (two) times daily as needed for cough. -     promethazine-codeine (PHENERGAN WITH CODEINE) 6.25-10 MG/5ML syrup; Take 5 mLs by mouth at bedtime as needed for cough.  Smoker  Lower respiratory infection -     azithromycin (ZITHROMAX) 250 MG tablet; Sig as indicated  Chest congestion -     pseudoephedrine-guaifenesin (MUCINEX D) 60-600 MG 12 hr tablet; Take 1 tablet by  mouth every 12 (twelve) hours for 5 days.    Patient Instructions       If you have lab work done today you will be contacted with your lab results within the next 2 weeks.  If you have not heard from [place] then please contact us. The fastest way to get your results is to register for My Chart.   IF you received an x-ray today, you will receive an invoice from [facility]. Please contact [facility] at [phone] with questions or concerns regarding your invoice.   IF you received labwork today, you will receive an invoice from [facility]. Please contact [facility] at [phone] with questions or concerns regarding your invoice.   Our billing staff will not be able to assist you with questions regarding bills from these companies.  You will be contacted with the lab results as soon as they are available. The fastest way to get your results is to activate your My Chart account. Instructions are located on the last page of this paperwork. If you have not heard from [place] regarding the results in 2 weeks, please contact this office.     Cough,  Adult A cough helps to clear your throat and lungs. A cough may last only 2-3 weeks (acute), or it may last longer than 8 weeks (chronic). Many different things can cause a cough. A cough may be a sign of an illness or another medical condition. Follow these instructions at home:  Pay attention to any changes in your cough.  Take medicines only as told by your doctor. ? If you were prescribed an antibiotic medicine, take it as told by your doctor. Do not stop taking it even if you start to feel better. ? Talk with your doctor before you try using a cough medicine.  Drink enough fluid to keep your pee (urine) clear or pale yellow.  If the air is dry, use a cold steam vaporizer or humidifier in your home.  Stay away from things that make you cough at work or at home.  If your cough is worse at night, try using extra pillows to raise your head up higher while you sleep.  Do not smoke, and try not to be around smoke. If you need help quitting, ask your doctor.  Do not have caffeine.  Do not drink alcohol.  Rest as needed. Contact a doctor if:  You have new problems (symptoms).  You cough up yellow fluid (pus).  Your cough does not get better after 2-3 weeks, or your cough gets worse.  Medicine does not help your cough and you are not sleeping well.  You have pain that gets worse or pain that is not helped with medicine.  You have a fever.  You are losing weight and you do not know why.  You have night sweats. Get help right away if:  You cough up blood.  You have trouble breathing.  Your heartbeat is very fast. This information is not intended to replace advice given to you by your health care provider. Make sure you discuss any questions you have with your health care provider. Document Released: 08/07/2011 Document Revised: 05/01/2016 Document Reviewed: 01/31/2015 Elsevier Interactive Patient Education  2018 Elsevier Inc.      Agustina Caroli, MD [facility]

## 2018-11-10 NOTE — Patient Instructions (Addendum)
     If you have lab work done today you will be contacted with your lab results within the next 2 weeks.  If you have not heard from us then please contact us. The fastest way to get your results is to register for My Chart.   IF you received an x-ray today, you will receive an invoice from [facility]. Please contact [facility] at [phone] with questions or concerns regarding your invoice.   IF you received labwork today, you will receive an invoice from [facility]. Please contact [facility] at [phone] with questions or concerns regarding your invoice.   Our billing staff will not be able to assist you with questions regarding bills from these companies.  You will be contacted with the lab results as soon as they are available. The fastest way to get your results is to activate your My Chart account. Instructions are located on the last page of this paperwork. If you have not heard from us regarding the results in 2 weeks, please contact this office.     Cough, Adult A cough helps to clear your throat and lungs. A cough may last only 2-3 weeks (acute), or it may last longer than 8 weeks (chronic). Many different things can cause a cough. A cough may be a sign of an illness or another medical condition. Follow these instructions at home:  Pay attention to any changes in your cough.  Take medicines only as told by your doctor. ? If you were prescribed an antibiotic medicine, take it as told by your doctor. Do not stop taking it even if you start to feel better. ? Talk with your doctor before you try using a cough medicine.  Drink enough fluid to keep your pee (urine) clear or pale yellow.  If the air is dry, use a cold steam vaporizer or humidifier in your home.  Stay away from things that make you cough at work or at home.  If your cough is worse at night, try using extra pillows to raise your head up higher while you sleep.  Do not smoke, and try not to be  around smoke. If you need help quitting, ask your doctor.  Do not have caffeine.  Do not drink alcohol.  Rest as needed. Contact a doctor if:  You have new problems (symptoms).  You cough up yellow fluid (pus).  Your cough does not get better after 2-3 weeks, or your cough gets worse.  Medicine does not help your cough and you are not sleeping well.  You have pain that gets worse or pain that is not helped with medicine.  You have a fever.  You are losing weight and you do not know why.  You have night sweats. Get help right away if:  You cough up blood.  You have trouble breathing.  Your heartbeat is very fast. This information is not intended to replace advice given to you by your health care provider. Make sure you discuss any questions you have with your health care provider. Document Released: 08/07/2011 Document Revised: 05/01/2016 Document Reviewed: 01/31/2015 Elsevier Interactive Patient Education  2018 Elsevier Inc.  

## 2019-01-16 ENCOUNTER — Emergency Department (HOSPITAL_COMMUNITY)
Admission: EM | Admit: 2019-01-16 | Discharge: 2019-01-16 | Discharge status: Absent without leave | Payer: BLUE CROSS/BLUE SHIELD | Attending: Emergency Medicine | Admitting: Emergency Medicine

## 2019-01-16 ENCOUNTER — Other Ambulatory Visit: Payer: Self-pay

## 2019-01-16 NOTE — ED Notes (Signed)
Pt checked in and this RN immediately went to take him to treatment room.  PT states he needs to step outside to tell his ride how long it will be.  Told him he was going straight to treatment room.  Pt went outside and never returned.

## 2019-10-03 ENCOUNTER — Other Ambulatory Visit: Payer: Self-pay | Admitting: Emergency Medicine

## 2019-10-03 NOTE — Telephone Encounter (Signed)
Medication Refill - Medication: colchicine 0.6 MG tablet [id number]     Preferred Pharmacy (with phone number or street name):  [facility], [place] [address]  Phone: [phone] Fax: [phone]     Agent: Please be advised that RX refills may take up to 3 business days. We ask that you follow-up with your pharmacy.

## 2020-11-13 ENCOUNTER — Telehealth: Payer: Self-pay | Admitting: Emergency Medicine

## 2020-11-13 ENCOUNTER — Encounter: Payer: Self-pay | Admitting: Family Medicine

## 2020-11-13 ENCOUNTER — Telehealth (INDEPENDENT_AMBULATORY_CARE_PROVIDER_SITE_OTHER): Payer: 59 | Admitting: Family Medicine

## 2020-11-13 ENCOUNTER — Other Ambulatory Visit: Payer: Self-pay

## 2020-11-13 VITALS — Ht 67.0 in

## 2020-11-13 DIAGNOSIS — J029 Acute pharyngitis, unspecified: Secondary | ICD-10-CM

## 2020-11-13 DIAGNOSIS — Z1152 Encounter for screening for COVID-19: Secondary | ICD-10-CM

## 2020-11-13 NOTE — Progress Notes (Signed)
Virtual Visit Note  I connected with patient on 11/13/20 at 1330 by telephone due to unable to work Epic video visit and verified that I am speaking with the correct person using two identifiers. Chase Higgins is currently located at home and no family members are currently with them during visit. The provider, Laurita Quint Maycol Hoying, FNP is located in their office at time of visit.  I discussed the limitations, risks, security and privacy concerns of performing an evaluation and management service by telephone and the availability of in person appointments. I also discussed with the patient that there may be a patient responsible charge related to this service. The patient expressed understanding and agreed to proceed.   I provided 20 minutes of non-face-to-face time during this encounter.  Chief Complaint  Patient presents with  . Sore Throat    chills, fever, body aches x 13 days - took nyquil, mucinex, chloroseptic, halls      HPI ? Symptoms started 12/3 Chills, voice was weak, fatigue Never check temperature Took nyquil and mucinex and rested Drank lots of liquids Feeling much better now No sick contacts Never lost taste and smell Has not gotten flu or covid vaccination Still has sore throat The rest of symptoms has resolved  Not on File  Prior to Admission medications   Medication Sig Start Date End Date Taking? Authorizing Provider  azithromycin (ZITHROMAX) 250 MG tablet Sig as indicated 11/10/18   Horald Pollen, MD  benzonatate (TESSALON) 200 MG capsule Take 1 capsule (200 mg total) by mouth 2 (two) times daily as needed for cough. 11/10/18   Horald Pollen, MD  Chlorphen-Pseudoephed-APAP [facility] FLU/COLD PO) Take by mouth 2 (two) times daily.    [provider]  colchicine 0.6 MG tablet Take 1 tablet (0.6 mg total) by mouth 2 (two) times daily. Patient not taking: Reported on 11/10/2018 07/07/18   Orpah Greek, MD  fenofibrate (TRICOR) 145  MG tablet Take 1 tablet (145 mg total) by mouth daily. Patient not taking: Reported on 11/10/2018 01/17/18   Rutherford Guys, MD  ibuprofen (ADVIL,MOTRIN) 800 MG tablet Take 1 tablet (800 mg total) by mouth 3 (three) times daily. Patient not taking: Reported on 11/10/2018 07/07/18   Orpah Greek, MD  oxyCODONE-acetaminophen (PERCOCET) 5-325 MG tablet Take 1-2 tablets by mouth every 4 (four) hours as needed. Patient not taking: Reported on 11/10/2018 07/07/18   Orpah Greek, MD  promethazine-codeine [facility] WITH CODEINE) 6.25-10 MG/5ML syrup Take 5 mLs by mouth at bedtime as needed for cough. 11/10/18   Horald Pollen, MD    Past Medical History:  Diagnosis Date  . Arthritis   . Diabetes mellitus without complication ([place])   . Gout     History reviewed. No pertinent surgical history.  Social History   Tobacco Use  . Smoking status: Current Every Day Smoker    Packs/day: 0.50    Years: 29.00    Pack years: 14.50    Types: Cigarettes  . Smokeless tobacco: Never Used  Substance Use Topics  . Alcohol use: Yes    Family History  Problem Relation Age of Onset  . Hypertension Mother   . Hyperlipidemia Mother   . Diabetes Father   . Stroke Father     Review of Systems  Constitutional: Positive for chills and malaise/fatigue. Negative for fever and weight loss.  HENT: Positive for sore throat. Negative for congestion, ear discharge, ear pain and sinus pain.   Respiratory: Negative  for cough, sputum production, shortness of breath and wheezing.   Cardiovascular: Negative for chest pain and palpitations.  Gastrointestinal: Negative for abdominal pain, heartburn, nausea and vomiting.  Neurological: Negative for headaches.    Objective  Constitutional:      General: She is not in acute distress.    Appearance: Normal appearance. She is not ill-appearing.   Pulmonary:     Effort: Pulmonary effort is normal. No respiratory distress.  Neurological:      Mental Status: She is alert and oriented to person, place, and time.  Psychiatric:        Mood and Affect: Mood normal.        Behavior: Behavior normal.     ASSESSMENT and PLAN  Problem List Items Addressed This Visit    None    Visit Diagnoses    Encounter for screening for COVID-19    -  Primary   Relevant Orders   Novel Coronavirus, NAA ([facility])   Sore throat     Continue conservative treatment   sore throat, gargle with salt water. Do this 3-4 times per day or as needed. To make a salt-water mixture, dissolve -1 tsp of salt in 1 cup of warm water. Make sure that all the salt dissolves.  Use nose drops made from salt water. This helps with stuffiness (congestion). It also helps soften the skin around your nose.  Drink enough fluid to keep your pee (urine) pale yellow.  RTC precautions provided       Return if symptoms worsen or fail to improve.    The above assessment and management plan was discussed with the patient. The patient verbalized understanding of and has agreed to the management plan. Patient is aware to call the clinic if symptoms persist or worsen. Patient is aware when to return to the clinic for a follow-up visit. Patient educated on when it is appropriate to go to the emergency department.     Huston Foley Aliyanna Wassmer, FNP-BC Primary Care at [facility] [place], [place] [address] Ph.  [phone] Fax [phone]

## 2020-11-13 NOTE — Telephone Encounter (Signed)
Pt stated that he gave the wrong dates for letter. Pt stated symptom started on 11/26 needing that changed. Please advise.

## 2020-11-13 NOTE — Patient Instructions (Signed)
Viral Respiratory Infection A viral respiratory infection is an illness that affects parts of the body that are used for breathing. These include the lungs, nose, and throat. It is caused by a germ called a virus. Some examples of this kind of infection are:  A cold.  The flu (influenza).  A respiratory syncytial virus (RSV) infection. A person who gets this illness may have the following symptoms:  A stuffy or runny nose.  Yellow or green fluid in the nose.  A cough.  Sneezing.  Tiredness (fatigue).  Achy muscles.  A sore throat.  Sweating or chills.  A fever.  A headache. Follow these instructions at home: Managing pain and congestion  Take over-the-counter and prescription medicines only as told by your doctor.  If you have a sore throat, gargle with salt water. Do this 3-4 times per day or as needed. To make a salt-water mixture, dissolve -1 tsp of salt in 1 cup of warm water. Make sure that all the salt dissolves.  Use nose drops made from salt water. This helps with stuffiness (congestion). It also helps soften the skin around your nose.  Drink enough fluid to keep your pee (urine) pale yellow. General instructions   Rest as much as possible.  Do not drink alcohol.  Do not use any products that have nicotine or tobacco, such as cigarettes and e-cigarettes. If you need help quitting, ask your doctor.  Keep all follow-up visits as told by your doctor. This is important. How is this prevented?   Get a flu shot every year. Ask your doctor when you should get your flu shot.  Do not let other people get your germs. If you are sick: ? Stay home from work or school. ? Wash your hands with soap and water often. Wash your hands after you cough or sneeze. If soap and water are not available, use hand sanitizer.  Avoid contact with people who are sick during cold and flu season. This is in fall and winter. Get help if:  Your symptoms last for 10 days or  longer.  Your symptoms get worse over time.  You have a fever.  You have very bad pain in your face or forehead.  Parts of your jaw or neck become very swollen. Get help right away if:  You feel pain or pressure in your chest.  You have shortness of breath.  You faint or feel like you will faint.  You keep throwing up (vomiting).  You feel confused. Summary  A viral respiratory infection is an illness that affects parts of the body that are used for breathing.  Examples of this illness include a cold, the flu, and respiratory syncytial virus (RSV) infection.  The infection can cause a runny nose, cough, sneezing, sore throat, and fever.  Follow what your doctor tells you about taking medicines, drinking lots of fluid, washing your hands, resting at home, and avoiding people who are sick. This information is not intended to replace advice given to you by your health care provider. Make sure you discuss any questions you have with your health care provider. Document Revised: 12/02/2018 Document Reviewed: 01/04/2018 Elsevier Patient Education  2020 [employer].

## 2020-11-14 ENCOUNTER — Ambulatory Visit (INDEPENDENT_AMBULATORY_CARE_PROVIDER_SITE_OTHER): Payer: 59 | Admitting: Family Medicine

## 2020-11-14 ENCOUNTER — Other Ambulatory Visit: Payer: Self-pay

## 2020-11-14 DIAGNOSIS — Z1152 Encounter for screening for COVID-19: Secondary | ICD-10-CM

## 2020-11-14 DIAGNOSIS — J029 Acute pharyngitis, unspecified: Secondary | ICD-10-CM

## 2020-11-14 NOTE — Progress Notes (Signed)
Pt came in this morning for a covid swab and letter.

## 2020-11-15 LAB — NOVEL CORONAVIRUS, NAA: SARS-CoV-2, NAA: NOT DETECTED

## 2020-11-15 LAB — SARS-COV-2, NAA 2 DAY TAT

## 2020-11-15 NOTE — Progress Notes (Signed)
If you could let him know his covid test was negative. Thanks!

## 2022-09-22 ENCOUNTER — Other Ambulatory Visit (HOSPITAL_COMMUNITY): Payer: Self-pay

## 2023-11-23 ENCOUNTER — Encounter (HOSPITAL_COMMUNITY): Payer: Self-pay

## 2023-11-23 ENCOUNTER — Other Ambulatory Visit: Payer: Self-pay

## 2023-11-23 ENCOUNTER — Emergency Department (HOSPITAL_COMMUNITY)
Admission: EM | Admit: 2023-11-23 | Discharge: 2023-11-23 | Disposition: A | Discharge status: Home / Self Care | Payer: BLUE CROSS/BLUE SHIELD | Attending: Emergency Medicine | Admitting: Emergency Medicine

## 2023-11-23 ENCOUNTER — Emergency Department (HOSPITAL_COMMUNITY): Payer: BLUE CROSS/BLUE SHIELD

## 2023-11-23 DIAGNOSIS — D72829 Elevated white blood cell count, unspecified: Secondary | ICD-10-CM | POA: Diagnosis not present

## 2023-11-23 DIAGNOSIS — T50901A Poisoning by unspecified drugs, medicaments and biological substances, accidental (unintentional), initial encounter: Secondary | ICD-10-CM | POA: Insufficient documentation

## 2023-11-23 LAB — COMPREHENSIVE METABOLIC PANEL
ALT: 12 U/L (ref 0–44)
AST: 16 U/L (ref 15–41)
Albumin: 3.4 g/dL — ABNORMAL LOW (ref 3.5–5.0)
Alkaline Phosphatase: 60 U/L (ref 38–126)
Anion gap: 12 (ref 5–15)
BUN: 8 mg/dL (ref 6–20)
CO2: 21 mmol/L — ABNORMAL LOW (ref 22–32)
Calcium: 8.1 mg/dL — ABNORMAL LOW (ref 8.9–10.3)
Chloride: 100 mmol/L (ref 98–111)
Creatinine, Ser: 1.28 mg/dL — ABNORMAL HIGH (ref 0.61–1.24)
GFR, Estimated: >60 mL/min (ref >60)
Glucose, Bld: 212 mg/dL — ABNORMAL HIGH (ref 70–99)
Potassium: 3.2 mmol/L — ABNORMAL LOW (ref 3.5–5.1)
Sodium: 133 mmol/L — ABNORMAL LOW (ref 135–145)
Total Bilirubin: 0.6 mg/dL (ref <1.2)
Total Protein: 6.5 g/dL (ref 6.5–8.1)

## 2023-11-23 LAB — CBC WITH DIFFERENTIAL/PLATELET
Abs Immature Granulocytes: 0.06 10*3/uL (ref 0.00–0.07)
Basophils Absolute: 0.1 10*3/uL (ref 0.0–0.1)
Basophils Relative: 1 %
Eosinophils Absolute: 0.5 10*3/uL (ref 0.0–0.5)
Eosinophils Relative: 3 %
HCT: 52 % (ref 39.0–52.0)
Hemoglobin: 17 g/dL (ref 13.0–17.0)
Immature Granulocytes: 0 %
Lymphocytes Relative: 17 %
Lymphs Abs: 2.5 10*3/uL (ref 0.7–4.0)
MCH: 30.2 pg (ref 26.0–34.0)
MCHC: 32.7 g/dL (ref 30.0–36.0)
MCV: 92.4 fL (ref 80.0–100.0)
Monocytes Absolute: 0.9 10*3/uL (ref 0.1–1.0)
Monocytes Relative: 6 %
Neutro Abs: 10.9 10*3/uL — ABNORMAL HIGH (ref 1.7–7.7)
Neutrophils Relative %: 73 %
Platelets: 309 10*3/uL (ref 150–400)
RBC: 5.63 MIL/uL (ref 4.22–5.81)
RDW: 13.8 % (ref 11.5–15.5)
WBC: 14.8 10*3/uL — ABNORMAL HIGH (ref 4.0–10.5)
nRBC: 0 % (ref 0.0–0.2)

## 2023-11-23 LAB — TROPONIN I (HIGH SENSITIVITY)
Troponin I (High Sensitivity): 8 ng/L (ref <18)
Troponin I (High Sensitivity): 15 ng/L (ref <18)

## 2023-11-23 LAB — ETHANOL: Alcohol, Ethyl (B): <10 mg/dL (ref <10)

## 2023-11-23 MED ORDER — NALOXONE HCL 4 MG/0.1ML NA LIQD
1.0000 | Freq: Once | NASAL | Status: DC
  Filled 2023-11-23: qty 4

## 2023-11-23 MED ORDER — SODIUM CHLORIDE 0.9 % IV SOLN
12.5000 mg | Freq: Once | INTRAVENOUS | Status: AC
  Administered 2023-11-23: 12.5 mg via INTRAVENOUS
  Filled 2023-11-23: qty 0.5

## 2023-11-23 MED ORDER — ONDANSETRON HCL 4 MG/2ML IJ SOLN
4.0000 mg | Freq: Once | INTRAMUSCULAR | Status: AC
  Administered 2023-11-23: 4 mg via INTRAVENOUS

## 2023-11-23 MED ORDER — ONDANSETRON 4 MG PO TBDP
4.0000 mg | ORAL_TABLET | Freq: Once | ORAL | Status: AC
  Administered 2023-11-23: 4 mg via ORAL
  Filled 2023-11-23: qty 1

## 2023-11-23 NOTE — Discharge Instructions (Addendum)
You likely overdosed last night.  The drugs you took may have been laced or mixed with opioids, including fentanyl or other dangerous drugs.  Your breathing slowed down, and you were not responding.  You were given Narcan by the first responders, and very briefly had CPR performed, before being brought to the hospital.  Thankfully your medical situation improved while you were watched in the emergency department for nearly 8 hours.  Your blood tests, vital signs, and x-rays did not show any signs of an ongoing medical emergency.  You were awake, walking steadily, and after a long period of observation, you were felt to be stable for discharge.  It is very important you avoid using any street drugs moving forward.  This can put you at serious risk of overdose and death.  You should not drive a car today or perform dangerous activities.  You should stay with a family member for the rest of the day. I encourage you to seek counseling or help for any addiction issues.

## 2023-11-23 NOTE — ED Notes (Signed)
Pt ambulatory at this time, did have an episode of vomiting after walking. MD informed.

## 2023-11-23 NOTE — ED Provider Notes (Signed)
52 yo male presenting with possible opioid overdose, unintentional Brief CPR prior to medic evaluation, but pulses present, narcan given by EMS with awake response, vomiting  Patient admits to using "crack" but thinks it may have been laced with an opioid  Now being monitored for somnolence, awakens to voice  Stable on room air Xray without evidence of aspiration  Patient did not want family contacted per Dr Madilyn Hook  Plan to monitor for improving sobriety  Physical Exam  BP (!) 137/90   Pulse 64   Temp 97.9 F (36.6 C) (Oral)   Resp 14   SpO2 98%   Physical Exam  Procedures  Procedures  ED Course / MDM    Medical Decision Making Amount and/or Complexity of Data Reviewed Labs: ordered. Radiology: ordered.  Risk Prescription drug management.   915 pm -patient observed for nearly 8 hours in the ED.  Is been nearly 6 hours in the last Narcan dose.  He is awake, mentating well, ambulating steadily, tolerating p.o.  He has a family member coming to safely pick him up and taking home.  At this point I think it is reasonable to discharge him.  There is no indication for hospitalization.  He is not hypoxic.  No indication for antibiotics.  The patient was encouraged to seek counseling and resources were provided regarding his ongoing drug use.  He was strongly discouraged from using illicit drugs and street drugs moving forward.  Patient verbalized understanding       Terald Sleeper, MD 11/23/23 [phone]

## 2023-11-23 NOTE — ED Triage Notes (Signed)
[place] [facility] [place] [facility], patient laying in ground from overdose. EMS started CPR enroute and administered Narcan and regained pulses. Pt c/o n/v.

## 2023-11-23 NOTE — ED Notes (Signed)
Pt denies nausea at this time.

## 2023-11-23 NOTE — ED Notes (Signed)
[place] [facility] [place], patient laying in ground from overdose. EMS started CPR enroute and administered Narcan and regained pulses. Pt c/o n/v.

## 2023-11-23 NOTE — ED Provider Notes (Signed)
[place] EMERGENCY DEPARTMENT AT [facility] Provider Note   CSN: [id number] Arrival date & time: 11/23/23  0143     History  Chief Complaint  Patient presents with   Drug Overdose    Chase Higgins is a 52 y.o. male.  The history is provided by the EMS personnel and the patient.   Chase Higgins is a 52 y.o. male who presents to the Emergency Department complaining of overdose.  He presents to the ED by EMS after potential overdose.  He was found unresponsive in the gas station parking lot per [employer].  CPR was initiated by [employer].  EMS reports brief PEA with return of circulation with strong pulses.  He was treated with 4 mg of Narcan.  He initially received assisted ventilations but has gone to improve mental status and route to the hospital.  Patient denies any complaints although he is actively dry heaving.  He does report that he uses crack cocaine.  No known medical problems.    Home Medications Prior to Admission medications   Medication Sig Start Date End Date Taking? Authorizing Provider  azithromycin (ZITHROMAX) 250 MG tablet Sig as indicated Patient not taking: Reported on 11/13/2020 11/10/18   Georgina Quint, MD  benzonatate (TESSALON) 200 MG capsule Take 1 capsule (200 mg total) by mouth 2 (two) times daily as needed for cough. Patient not taking: Reported on 11/13/2020 11/10/18   Georgina Quint, MD  Chlorphen-Pseudoephed-APAP [facility] FLU/COLD PO) Take by mouth 2 (two) times daily. Patient not taking: Reported on 11/13/2020    [provider]  colchicine 0.6 MG tablet Take 1 tablet (0.6 mg total) by mouth 2 (two) times daily. Patient not taking: Reported on 11/10/2018 07/07/18   Gilda Crease, MD  fenofibrate (TRICOR) 145 MG tablet Take 1 tablet (145 mg total) by mouth daily. Patient not taking: Reported on 11/10/2018 01/17/18   Lezlie Lye, Meda Coffee, MD  ibuprofen (ADVIL,MOTRIN) 800 MG tablet Take 1 tablet (800 mg total) by mouth 3  (three) times daily. Patient not taking: Reported on 11/10/2018 07/07/18   Gilda Crease, MD  oxyCODONE-acetaminophen (PERCOCET) 5-325 MG tablet Take 1-2 tablets by mouth every 4 (four) hours as needed. Patient not taking: Reported on 11/10/2018 07/07/18   Gilda Crease, MD  promethazine-codeine [facility] WITH CODEINE) 6.25-10 MG/5ML syrup Take 5 mLs by mouth at bedtime as needed for cough. Patient not taking: Reported on 11/13/2020 11/10/18   Georgina Quint, MD      Allergies    Patient has no allergy information on record.    Review of Systems   Review of Systems  All other systems reviewed and are negative.   Physical Exam Updated Vital Signs BP 132/83 (BP Location: Right Arm)   Pulse 89   Temp 97.9 F (36.6 C) (Oral)   Resp 12   SpO2 95%  Physical Exam Vitals and nursing note reviewed.  Constitutional:      Appearance: He is well-developed.  HENT:     Head: Normocephalic and atraumatic.  Cardiovascular:     Rate and Rhythm: Normal rate and regular rhythm.     Heart sounds: No murmur heard. Pulmonary:     Effort: Pulmonary effort is normal. No respiratory distress.     Breath sounds: Normal breath sounds.  Abdominal:     Palpations: Abdomen is soft.     Tenderness: There is no abdominal tenderness. There is no guarding or rebound.  Musculoskeletal:  General: No swelling or tenderness.  Skin:    General: Skin is warm and dry.  Neurological:     Mental Status: He is alert and oriented to person, place, and time.  Psychiatric:        Behavior: Behavior normal.     ED Results / Procedures / Treatments   Labs (all labs ordered are listed, but only abnormal results are displayed) Labs Reviewed  COMPREHENSIVE METABOLIC PANEL - Abnormal; Notable for the following components:      Result Value   Sodium 133 (*)    Potassium 3.2 (*)    CO2 21 (*)    Glucose, Bld 212 (*)    Creatinine, Ser 1.28 (*)    Calcium 8.1 (*)    Albumin 3.4 (*)     All other components within normal limits  CBC WITH DIFFERENTIAL/PLATELET - Abnormal; Notable for the following components:   WBC 14.8 (*)    Neutro Abs 10.9 (*)    All other components within normal limits  ETHANOL  TROPONIN I (HIGH SENSITIVITY)  TROPONIN I (HIGH SENSITIVITY)    EKG EKG Interpretation Date/Time:  Monday November 23 2023 01:45:19 EST Ventricular Rate:  86 PR Interval:  171 QRS Duration:  112 QT Interval:  416 QTC Calculation: 498 R Axis:   -84  Text Interpretation: Sinus rhythm Incomplete RBBB and LAFB Abnormal R-wave progression, late transition Borderline prolonged QT interval Confirmed by Tilden Fossa [phone]) on 11/23/2023 2:00:08 AM  Radiology DG Chest Port 1 View Result Date: 11/23/2023 CLINICAL DATA:  Overdose EXAM: PORTABLE CHEST 1 VIEW COMPARISON:  Chest x-ray 10/11/2013 FINDINGS: The heart size and mediastinal contours are within normal limits. Both lungs are clear. The visualized skeletal structures are unremarkable. IMPRESSION: No active disease. Electronically Signed   By: Darliss Cheney M.D.   On: 11/23/2023 02:07    Procedures Procedures    Medications Ordered in ED Medications  ondansetron (ZOFRAN) injection 4 mg (4 mg Intravenous Given 11/23/23 0148)  promethazine (PHENERGAN) 12.5 mg in sodium chloride 0.9 % 50 mL IVPB (0 mg Intravenous Stopped 11/23/23 0310)    ED Course/ Medical Decision Making/ A&P                                 Medical Decision Making Amount and/or Complexity of Data Reviewed Labs: ordered. Radiology: ordered.  Risk Prescription drug management.   Patient here for evaluation following accidental overdose that responded to Narcan.  He did have brief CPR prior to EMS arrival.  Patient is drowsy but interactive at time of ED arrival.  No complaints but is vomiting.  Troponins are negative x 2.  Chest x-ray without acute disease-images personally reviewed and interpreted, agree with radiologist interpretation.   Patient does report using crack cocaine at times, does not have a history of narcotic use.  CBC with mild leukocytosis, no evidence of acute infectious process at this time.  BMP with mild hyponatremia.  Patient is significantly improved on repeat assessment but does continue to be slightly intoxicated appearing.  Patient care transferred pending metabolization of drugs.        Final Clinical Impression(s) / ED Diagnoses Final diagnoses:  Accidental overdose, initial encounter    Rx / DC Orders ED Discharge Orders     None         Tilden Fossa, MD 11/23/23 [phone]

## 2023-11-23 NOTE — ED Notes (Signed)
Pt. Ambulated around blu-orange zone. Pt. Stated slight dizziness upon beginning that quickly resolved. Pt. Had steady gait throughout

## 2024-03-29 ENCOUNTER — Emergency Department (HOSPITAL_COMMUNITY)

## 2024-03-29 ENCOUNTER — Other Ambulatory Visit: Payer: Self-pay

## 2024-03-29 ENCOUNTER — Emergency Department (HOSPITAL_COMMUNITY)
Admission: EM | Admit: 2024-03-29 | Discharge: 2024-03-29 | Disposition: A | Discharge status: Home / Self Care | Attending: Emergency Medicine | Admitting: Emergency Medicine

## 2024-03-29 ENCOUNTER — Encounter (HOSPITAL_COMMUNITY): Payer: Self-pay

## 2024-03-29 DIAGNOSIS — S9031XA Contusion of right foot, initial encounter: Secondary | ICD-10-CM | POA: Insufficient documentation

## 2024-03-29 DIAGNOSIS — R410 Disorientation, unspecified: Secondary | ICD-10-CM | POA: Diagnosis not present

## 2024-03-29 DIAGNOSIS — S3991XA Unspecified injury of abdomen, initial encounter: Secondary | ICD-10-CM | POA: Diagnosis not present

## 2024-03-29 DIAGNOSIS — S8001XA Contusion of right knee, initial encounter: Secondary | ICD-10-CM | POA: Diagnosis not present

## 2024-03-29 DIAGNOSIS — S2231XA Fracture of one rib, right side, initial encounter for closed fracture: Secondary | ICD-10-CM | POA: Insufficient documentation

## 2024-03-29 DIAGNOSIS — Y92410 Unspecified street and highway as the place of occurrence of the external cause: Secondary | ICD-10-CM | POA: Diagnosis not present

## 2024-03-29 DIAGNOSIS — S80812A Abrasion, left lower leg, initial encounter: Secondary | ICD-10-CM | POA: Insufficient documentation

## 2024-03-29 DIAGNOSIS — Z23 Encounter for immunization: Secondary | ICD-10-CM | POA: Diagnosis not present

## 2024-03-29 DIAGNOSIS — S299XXA Unspecified injury of thorax, initial encounter: Secondary | ICD-10-CM | POA: Diagnosis present

## 2024-03-29 LAB — CBC
HCT: 51.5 % (ref 39.0–52.0)
Hemoglobin: 17.2 g/dL — ABNORMAL HIGH (ref 13.0–17.0)
MCH: 30.6 pg (ref 26.0–34.0)
MCHC: 33.4 g/dL (ref 30.0–36.0)
MCV: 91.6 fL (ref 80.0–100.0)
Platelets: 276 10*3/uL (ref 150–400)
RBC: 5.62 MIL/uL (ref 4.22–5.81)
RDW: 14.2 % (ref 11.5–15.5)
WBC: 12.5 10*3/uL — ABNORMAL HIGH (ref 4.0–10.5)
nRBC: 0 % (ref 0.0–0.2)

## 2024-03-29 LAB — COMPREHENSIVE METABOLIC PANEL WITH GFR
ALT: 37 U/L (ref 0–44)
AST: 60 U/L — ABNORMAL HIGH (ref 15–41)
Albumin: 3.6 g/dL (ref 3.5–5.0)
Alkaline Phosphatase: 55 U/L (ref 38–126)
Anion gap: 11 (ref 5–15)
BUN: 14 mg/dL (ref 6–20)
CO2: 23 mmol/L (ref 22–32)
Calcium: 8.8 mg/dL — ABNORMAL LOW (ref 8.9–10.3)
Chloride: 103 mmol/L (ref 98–111)
Creatinine, Ser: 1.49 mg/dL — ABNORMAL HIGH (ref 0.61–1.24)
GFR, Estimated: 55 mL/min — ABNORMAL LOW (ref >60)
Glucose, Bld: 125 mg/dL — ABNORMAL HIGH (ref 70–99)
Potassium: 3.8 mmol/L (ref 3.5–5.1)
Sodium: 137 mmol/L (ref 135–145)
Total Bilirubin: 1.1 mg/dL (ref 0.0–1.2)
Total Protein: 6.7 g/dL (ref 6.5–8.1)

## 2024-03-29 LAB — I-STAT CHEM 8, ED
BUN: 20 mg/dL (ref 6–20)
Calcium, Ion: 1.06 mmol/L — ABNORMAL LOW (ref 1.15–1.40)
Chloride: 106 mmol/L (ref 98–111)
Creatinine, Ser: 1.5 mg/dL — ABNORMAL HIGH (ref 0.61–1.24)
Glucose, Bld: 126 mg/dL — ABNORMAL HIGH (ref 70–99)
HCT: 51 % (ref 39.0–52.0)
Hemoglobin: 17.3 g/dL — ABNORMAL HIGH (ref 13.0–17.0)
Potassium: 3.9 mmol/L (ref 3.5–5.1)
Sodium: 140 mmol/L (ref 135–145)
TCO2: 25 mmol/L (ref 22–32)

## 2024-03-29 LAB — URINALYSIS, ROUTINE W REFLEX MICROSCOPIC
Bilirubin Urine: NEGATIVE
Glucose, UA: NEGATIVE mg/dL
Hgb urine dipstick: NEGATIVE
Ketones, ur: 5 mg/dL — AB
Leukocytes,Ua: NEGATIVE
Nitrite: NEGATIVE
Protein, ur: NEGATIVE mg/dL
Specific Gravity, Urine: 1.038 — ABNORMAL HIGH (ref 1.005–1.030)
pH: 6 (ref 5.0–8.0)

## 2024-03-29 LAB — ETHANOL: Alcohol, Ethyl (B): <10 mg/dL (ref <15)

## 2024-03-29 LAB — PROTIME-INR
INR: 1 (ref 0.8–1.2)
Prothrombin Time: 13 s (ref 11.4–15.2)

## 2024-03-29 LAB — I-STAT CG4 LACTIC ACID, ED: Lactic Acid, Venous: 1.4 mmol/L (ref 0.5–1.9)

## 2024-03-29 LAB — SAMPLE TO BLOOD BANK

## 2024-03-29 MED ORDER — TETANUS-DIPHTH-ACELL PERTUSSIS 5-2.5-18.5 LF-MCG/0.5 IM SUSY
0.5000 mL | PREFILLED_SYRINGE | Freq: Once | INTRAMUSCULAR | Status: AC
  Administered 2024-03-29: 0.5 mL via INTRAMUSCULAR

## 2024-03-29 MED ORDER — SODIUM CHLORIDE 0.9 % IV BOLUS
500.0000 mL | Freq: Once | INTRAVENOUS | Status: AC
  Administered 2024-03-29: 500 mL via INTRAVENOUS

## 2024-03-29 MED ORDER — CEFAZOLIN SODIUM-DEXTROSE 2-4 GM/100ML-% IV SOLN
2.0000 g | INTRAVENOUS | Status: AC
  Administered 2024-03-29: 2 g via INTRAVENOUS

## 2024-03-29 MED ORDER — LIDOCAINE 5 % EX PTCH
1.0000 | MEDICATED_PATCH | CUTANEOUS | Status: DC
Start: 2024-03-29 — End: 2024-03-29
  Administered 2024-03-29: 1 via TRANSDERMAL
  Filled 2024-03-29: qty 1

## 2024-03-29 MED ORDER — DICLOFENAC EPOLAMINE 1.3 % EX PTCH
1.0000 | MEDICATED_PATCH | Freq: Two times a day (BID) | CUTANEOUS | Status: DC
  Administered 2024-03-29: 1 via TRANSDERMAL
  Filled 2024-03-29 (×2): qty 1

## 2024-03-29 MED ORDER — LIDOCAINE 5 % EX PTCH: 1.0000 | MEDICATED_PATCH | CUTANEOUS | 0 refills | Status: DC

## 2024-03-29 MED ORDER — HYDROCODONE-ACETAMINOPHEN 5-325 MG PO TABS: 1.0000 | ORAL_TABLET | Freq: Four times a day (QID) | ORAL | 0 refills | Status: DC | PRN

## 2024-03-29 MED ORDER — IOHEXOL 350 MG/ML SOLN
100.0000 mL | Freq: Once | INTRAVENOUS | Status: AC | PRN
  Administered 2024-03-29: 100 mL via INTRAVENOUS
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
## 2024-04-04 ENCOUNTER — Ambulatory Visit: Payer: Self-pay
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
## 2024-04-06 ENCOUNTER — Ambulatory Visit
Admission: EM | Admit: 2024-04-06 | Discharge: 2024-04-06 | Disposition: A | Discharge status: Home / Self Care | Attending: Internal Medicine | Admitting: Internal Medicine

## 2024-04-06 DIAGNOSIS — M25561 Pain in right knee: Secondary | ICD-10-CM | POA: Diagnosis not present

## 2024-04-06 DIAGNOSIS — L089 Local infection of the skin and subcutaneous tissue, unspecified: Secondary | ICD-10-CM | POA: Diagnosis not present

## 2024-04-06 DIAGNOSIS — T07XXXA Unspecified multiple injuries, initial encounter: Secondary | ICD-10-CM

## 2024-04-06 MED ORDER — HYDROCODONE-ACETAMINOPHEN 5-325 MG PO TABS: 1.0000 | ORAL_TABLET | Freq: Four times a day (QID) | ORAL | 0 refills | Status: DC | PRN

## 2024-04-06 MED ORDER — DEXAMETHASONE SODIUM PHOSPHATE 10 MG/ML IJ SOLN
10.0000 mg | Freq: Once | INTRAMUSCULAR | Status: AC
  Administered 2024-04-06: 10 mg via INTRAMUSCULAR
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
## 2024-04-06 NOTE — Discharge Instructions (Addendum)
 Your wound of your left leg appears to be infected today.  Please take Augmentin  antibiotic twice daily for the next 7 days. Apply mupirocin ointment twice daily for the next 7 days (every 12 hours) to the wounds topically. Apply warm compresses to the wounds to reduce swelling and pain.  Your hydrocodone /acetaminophen  tablets have been refilled. I have given you 6 tablets.  You may take regular strength Tylenol  (650 mg) every 6 hours as needed for pain. If your pain is not well-controlled with as needed use of regular strength Tylenol , you may take 1 tablet of hydrocodone -acetaminophen  every 6 hours as needed for severe pain.  Do not take hydrocodone -acetaminophen  and drive, drink alcohol, or go to work as this can make you sleepy.  Mostly take this at nighttime.  We gave you a shot of steroid in the clinic to help reduce inflammation to the right knee.   Please schedule an appointment for follow-up with the orthopedic provider listed on your paperwork. [facility] also has an urgent care clinic with walk-in appointments available.  If you develop any new or worsening symptoms or if your symptoms do not start to improve, please return here or follow-up with your primary care provider. If your symptoms are severe, please go to the emergency room.
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
## 2024-06-08 ENCOUNTER — Other Ambulatory Visit: Payer: Self-pay

## 2024-06-08 ENCOUNTER — Emergency Department (HOSPITAL_COMMUNITY)
Admission: EM | Admit: 2024-06-08 | Discharge: 2024-06-09 | Disposition: A | Discharge status: Other Acute Inpatient Hospital | Attending: Emergency Medicine | Admitting: Emergency Medicine

## 2024-06-08 ENCOUNTER — Encounter (HOSPITAL_COMMUNITY): Payer: Self-pay

## 2024-06-08 DIAGNOSIS — Z7984 Long term (current) use of oral hypoglycemic drugs: Secondary | ICD-10-CM | POA: Insufficient documentation

## 2024-06-08 DIAGNOSIS — F1420 Cocaine dependence, uncomplicated: Secondary | ICD-10-CM | POA: Insufficient documentation

## 2024-06-08 DIAGNOSIS — F1220 Cannabis dependence, uncomplicated: Secondary | ICD-10-CM | POA: Insufficient documentation

## 2024-06-08 DIAGNOSIS — R45851 Suicidal ideations: Secondary | ICD-10-CM | POA: Insufficient documentation

## 2024-06-08 DIAGNOSIS — F1020 Alcohol dependence, uncomplicated: Secondary | ICD-10-CM | POA: Diagnosis not present

## 2024-06-08 DIAGNOSIS — F1994 Other psychoactive substance use, unspecified with psychoactive substance-induced mood disorder: Secondary | ICD-10-CM | POA: Diagnosis present

## 2024-06-08 DIAGNOSIS — F1924 Other psychoactive substance dependence with psychoactive substance-induced mood disorder: Secondary | ICD-10-CM | POA: Insufficient documentation

## 2024-06-08 DIAGNOSIS — R519 Headache, unspecified: Secondary | ICD-10-CM | POA: Diagnosis present

## 2024-06-08 DIAGNOSIS — Z5900 Homelessness unspecified: Secondary | ICD-10-CM | POA: Diagnosis not present

## 2024-06-08 DIAGNOSIS — F1910 Other psychoactive substance abuse, uncomplicated: Secondary | ICD-10-CM

## 2024-06-08 DIAGNOSIS — E119 Type 2 diabetes mellitus without complications: Secondary | ICD-10-CM | POA: Diagnosis not present

## 2024-06-08 LAB — COMPREHENSIVE METABOLIC PANEL WITH GFR
ALT: 12 U/L (ref 0–44)
AST: 14 U/L — ABNORMAL LOW (ref 15–41)
Albumin: 3.4 g/dL — ABNORMAL LOW (ref 3.5–5.0)
Alkaline Phosphatase: 61 U/L (ref 38–126)
Anion gap: 12 (ref 5–15)
BUN: 11 mg/dL (ref 6–20)
CO2: 23 mmol/L (ref 22–32)
Calcium: 8.9 mg/dL (ref 8.9–10.3)
Chloride: 104 mmol/L (ref 98–111)
Creatinine, Ser: 1.11 mg/dL (ref 0.61–1.24)
GFR, Estimated: >60 mL/min (ref >60)
Glucose, Bld: 111 mg/dL — ABNORMAL HIGH (ref 70–99)
Potassium: 3.6 mmol/L (ref 3.5–5.1)
Sodium: 139 mmol/L (ref 135–145)
Total Bilirubin: 0.6 mg/dL (ref 0.0–1.2)
Total Protein: 6.7 g/dL (ref 6.5–8.1)

## 2024-06-08 LAB — SALICYLATE LEVEL: Salicylate Lvl: <7 mg/dL — ABNORMAL LOW (ref 7.0–30.0)

## 2024-06-08 LAB — CBC WITH DIFFERENTIAL/PLATELET
Abs Immature Granulocytes: 0.08 10*3/uL — ABNORMAL HIGH (ref 0.00–0.07)
Basophils Absolute: 0.1 10*3/uL (ref 0.0–0.1)
Basophils Relative: 1 %
Eosinophils Absolute: 0.7 10*3/uL — ABNORMAL HIGH (ref 0.0–0.5)
Eosinophils Relative: 8 %
HCT: 50 % (ref 39.0–52.0)
Hemoglobin: 16.8 g/dL (ref 13.0–17.0)
Immature Granulocytes: 1 %
Lymphocytes Relative: 20 %
Lymphs Abs: 1.9 10*3/uL (ref 0.7–4.0)
MCH: 31.6 pg (ref 26.0–34.0)
MCHC: 33.6 g/dL (ref 30.0–36.0)
MCV: 94.2 fL (ref 80.0–100.0)
Monocytes Absolute: 0.9 10*3/uL (ref 0.1–1.0)
Monocytes Relative: 9 %
Neutro Abs: 6 10*3/uL (ref 1.7–7.7)
Neutrophils Relative %: 61 %
Platelets: 324 10*3/uL (ref 150–400)
RBC: 5.31 MIL/uL (ref 4.22–5.81)
RDW: 14 % (ref 11.5–15.5)
WBC: 9.7 10*3/uL (ref 4.0–10.5)
nRBC: 0 % (ref 0.0–0.2)

## 2024-06-08 LAB — CBG MONITORING, ED: Glucose-Capillary: 116 mg/dL — ABNORMAL HIGH (ref 70–99)

## 2024-06-08 LAB — ACETAMINOPHEN LEVEL: Acetaminophen (Tylenol), Serum: <10 ug/mL — ABNORMAL LOW (ref 10–30)

## 2024-06-08 LAB — ETHANOL: Alcohol, Ethyl (B): <15 mg/dL (ref <15)

## 2024-06-08 MED ORDER — ONDANSETRON HCL 4 MG/2ML IJ SOLN
4.0000 mg | Freq: Once | INTRAMUSCULAR | Status: AC
  Administered 2024-06-08: 4 mg via INTRAVENOUS
  Filled 2024-06-08: qty 2

## 2024-06-08 MED ORDER — FOLIC ACID 1 MG PO TABS
1.0000 mg | ORAL_TABLET | Freq: Once | ORAL | Status: AC
  Administered 2024-06-08: 1 mg via ORAL
  Filled 2024-06-08: qty 1

## 2024-06-08 MED ORDER — LORAZEPAM 1 MG PO TABS: 0.0000 mg | ORAL_TABLET | Freq: Two times a day (BID) | ORAL | Status: DC

## 2024-06-08 MED ORDER — LORAZEPAM 1 MG PO TABS: 1.0000 mg | ORAL_TABLET | Freq: Four times a day (QID) | ORAL | Status: DC

## 2024-06-08 MED ORDER — LORAZEPAM 1 MG PO TABS: 0.0000 mg | ORAL_TABLET | Freq: Four times a day (QID) | ORAL | Status: DC

## 2024-06-08 MED ORDER — LORAZEPAM 1 MG PO TABS: 1.0000 mg | ORAL_TABLET | Freq: Two times a day (BID) | ORAL | Status: DC

## 2024-06-08 MED ORDER — THIAMINE MONONITRATE 100 MG PO TABS
100.0000 mg | ORAL_TABLET | Freq: Every day | ORAL | Status: DC
  Administered 2024-06-08 – 2024-06-09 (×2): 100 mg via ORAL
  Filled 2024-06-08 (×2): qty 1

## 2024-06-08 MED ORDER — THIAMINE HCL 100 MG/ML IJ SOLN: 100.0000 mg | Freq: Every day | INTRAMUSCULAR | Status: DC

## 2024-06-08 NOTE — ED Provider Notes (Signed)
 [place] EMERGENCY DEPARTMENT AT [facility] Provider Note   CSN: [id number] Arrival date & time: 06/08/24  1823     History  Chief Complaint  Patient presents with   Drug / Alcohol Assessment   Suicidal    Chase Higgins is a 53 y.o. male with PMH as listed below who presents with alcohol detox, last drink was today, states he drinks a lot daily and cocaine use. Pt also endorsing SI related to his substance use. Last drink was earlier today. Endorses nausea with no vomiting, no abdominal pain. Endorses mild all-over headache and that the lights are bright. No falls/head trauma.    Past Medical History:  Diagnosis Date   Arthritis    Diabetes mellitus without complication (HCC)    Gout        Home Medications Prior to Admission medications   Medication Sig Start Date End Date Taking? Authorizing Provider  atorvastatin  (LIPITOR) 40 MG tablet Take 1 tablet (40 mg total) by mouth at bedtime. 06/11/24   Zouev, Dmitri, MD  fenofibrate  micronized (LOFIBRA) 134 MG capsule Take 134 mg by mouth at bedtime. 04/21/24   [provider]  metFORMIN  (GLUCOPHAGE -XR) 500 MG 24 hr tablet Take 1 tablet (500 mg total) by mouth 2 (two) times daily with a meal. 06/11/24   Zouev, Dmitri, MD  Multiple Vitamin (MULTIVITAMIN WITH MINERALS) TABS tablet Take 1 tablet by mouth daily.    [provider]  nicotine  (NICODERM CQ  - DOSED IN MG/24 HOURS) 14 mg/24hr patch Place 1 patch (14 mg total) onto the skin daily at 6 (six) AM. 06/12/24   Zouev, Dmitri, MD  terbinafine (LAMISIL) 1 % cream Apply 1 Application topically 2 (two) times daily as needed (Apply to affected area). 04/22/24   [provider]  thiamine  (VITAMIN B-1) 100 MG tablet Take 1 tablet (100 mg total) by mouth daily. 06/12/24   Zouev, Dmitri, MD  traZODone  (DESYREL ) 50 MG tablet Take 1 tablet (50 mg total) by mouth at bedtime as needed for sleep. 06/11/24   Zouev, Dmitri, MD      Allergies    Patient has no known  allergies.    Review of Systems   Review of Systems A 10 point review of systems was performed and is negative unless otherwise reported in HPI.  Physical Exam Updated Vital Signs BP 110/82   Pulse 75   Temp 98.4 F (36.9 C)   Resp 16   SpO2 98%  Physical Exam General: Normal appearing male, lying in bed.  HEENT: PERRLA, Sclera anicteric, MMM, trachea midline. + Tongue fasciculations. Cardiology: RRR, no murmurs/rubs/gallops. Resp: Normal respiratory rate and effort. CTAB, no wheezes, rhonchi, crackles.  Abd: Soft, non-tender, non-distended. No rebound tenderness or guarding.  GU: Deferred. MSK: No peripheral edema or signs of trauma. Extremities without deformity or TTP. No cyanosis or clubbing. Skin: warm, dry. No rashes or lesions. Back: No CVA tenderness Neuro: A&Ox4, CNs II-XII grossly intact. MAEs. Sensation grossly intact.  No tremor with arms extended. Psych: Mildly anxious.  ED Results / Procedures / Treatments   Labs (all labs ordered are listed, but only abnormal results are displayed) Labs Reviewed  CBC WITH DIFFERENTIAL/PLATELET - Abnormal; Notable for the following components:      Result Value   Eosinophils Absolute 0.7 (*)    Abs Immature Granulocytes 0.08 (*)    All other components within normal limits  SALICYLATE LEVEL - Abnormal; Notable for the following components:   Salicylate Lvl <7.0 (*)  All other components within normal limits  ACETAMINOPHEN  LEVEL - Abnormal; Notable for the following components:   Acetaminophen  (Tylenol ), Serum <10 (*)    All other components within normal limits  COMPREHENSIVE METABOLIC PANEL WITH GFR - Abnormal; Notable for the following components:   Glucose, Bld 111 (*)    Albumin 3.4 (*)    AST 14 (*)    All other components within normal limits  RAPID URINE DRUG SCREEN, HOSP PERFORMED - Abnormal; Notable for the following components:   Cocaine POSITIVE (*)    Benzodiazepines POSITIVE (*)    All other components  within normal limits  CBG MONITORING, ED - Abnormal; Notable for the following components:   Glucose-Capillary 116 (*)    All other components within normal limits  ETHANOL  URINALYSIS, ROUTINE W REFLEX MICROSCOPIC    EKG EKG Interpretation Date/Time:  Wednesday June 08 2024 22:25:43 EDT Ventricular Rate:  70 PR Interval:  168 QRS Duration:  113 QT Interval:  479 QTC Calculation: 517 R Axis:   270  Text Interpretation: Sinus rhythm Left anterior fascicular block Abnormal R-wave progression, late transition Minimal ST elevation, anterior leads Prolonged QT interval Confirmed by Franklyn Gills [phone]) on 06/08/2024 10:56:38 PM  Radiology No results found.  Procedures Procedures    Medications Ordered in ED Medications  folic acid  (FOLVITE ) tablet 1 mg (1 mg Oral Given 06/08/24 2231)  ondansetron  (ZOFRAN ) injection 4 mg (4 mg Intravenous Given 06/08/24 2232)    ED Course/ Medical Decision Making/ A&P                          Medical Decision Making Amount and/or Complexity of Data Reviewed Labs: ordered. Decision-making details documented in ED Course.  Risk Prescription drug management.    This patient presents to the ED for concern of alcohol detox, this involves an extensive number of treatment options, and is a complaint that carries with it a high risk of complications and morbidity.  I considered the following differential and admission for this acute, potentially life threatening condition.   MDM:    Patient complaining of symptoms of alcohol withdrawal.  He does feel slightly anxious, lites seem to break, mild headache, and tongue fasciculations no no tremor with arms extended.  Mild nausea with no vomiting.  He is given folic acid /thiamine , Zofran , and is not in any severe alcohol withdrawal so is started on CIWA protocol. His symptoms improve.  No significant electrolyte derangements or hypo-/hyperglycemia. Patient is medically cleared at this time.   Clinical  Course as of 06/12/24 2010  Wed Jun 08, 2024  2145 Glucose-Capillary(!): 116 [HN]  2207 CBC with Differential(!) Unremarkable in the context of this patient's presentation  [HN]  2302 CIWA protocol placed. Patient HDS.  [HN]    Clinical Course User Index [HN] Franklyn Gills SAILOR, MD    Labs: I Ordered, and personally interpreted labs.  The pertinent results include: Those listed above  Additional history obtained from chart review.    Cardiac Monitoring: The patient was maintained on a cardiac monitor.  I personally viewed and interpreted the cardiac monitored which showed an underlying rhythm of: NSR  Reevaluation: After the interventions noted above, I reevaluated the patient and found that they have :improved  Social Determinants of Health:  lives independently  Disposition:  Medically cleared  Co morbidities that complicate the patient evaluation  Past Medical History:  Diagnosis Date   Arthritis    Diabetes mellitus without complication (  HCC)    Gout      Medicines Meds ordered this encounter  Medications   DISCONTD: thiamine  (VITAMIN B1) tablet 100 mg   DISCONTD: thiamine  (VITAMIN B1) injection 100 mg   folic acid  (FOLVITE ) tablet 1 mg   ondansetron  (ZOFRAN ) injection 4 mg   DISCONTD: LORazepam  (ATIVAN ) tablet 0-4 mg    CIWA-AR < 5 =:   0 mg    CIWA-AR 5 -10 =:   1 mg    CIWA-AR 11 -15 =:   2 mg    CIWA-AR 16 -20 =:   3 mg    CIWA-AR 16 -20 =:   Recheck CIWA-AR in 1 hour; if > 20 notify MD    CIWA-AR > 20 =:   4 mg    CIWA-AR > 20 =:   Notify MD   DISCONTD: LORazepam  (ATIVAN ) tablet 0-4 mg    CIWA-AR < 5 =:   0 mg    CIWA-AR 5 -10 =:   1 mg    CIWA-AR 11 -15 =:   2 mg    CIWA-AR 16 -20 =:   3 mg    CIWA-AR 16 -20 =:   Recheck CIWA-AR in 1 hour; if > 20 notify MD    CIWA-AR > 20 =:   4 mg    CIWA-AR > 20 =:   Notify MD   DISCONTD: LORazepam  (ATIVAN ) tablet 1 mg   DISCONTD: LORazepam  (ATIVAN ) tablet 1 mg    I have reviewed the patients home medicines and  have made adjustments as needed  Problem List / ED Course: Problem List Items Addressed This Visit   None Visit Diagnoses       Polysubstance abuse (HCC)    -  Primary                   This note was created using dictation software, which may contain spelling or grammatical errors.    Franklyn Sid SAILOR, MD 06/12/24 2026

## 2024-06-08 NOTE — ED Triage Notes (Signed)
 Pt here for alcohol detox, last drink was today, states he drinks a lot daily and cocaine use. Pt also endorsing SI.

## 2024-06-09 ENCOUNTER — Other Ambulatory Visit (HOSPITAL_COMMUNITY)
Admission: EM | Admit: 2024-06-09 | Discharge: 2024-06-11 | Discharge status: Against Medical Advice | Source: Intra-hospital | Attending: Psychiatry | Admitting: Psychiatry

## 2024-06-09 ENCOUNTER — Encounter (HOSPITAL_COMMUNITY): Payer: Self-pay | Admitting: Psychiatry

## 2024-06-09 DIAGNOSIS — F1994 Other psychoactive substance use, unspecified with psychoactive substance-induced mood disorder: Secondary | ICD-10-CM

## 2024-06-09 DIAGNOSIS — F1220 Cannabis dependence, uncomplicated: Secondary | ICD-10-CM | POA: Diagnosis not present

## 2024-06-09 DIAGNOSIS — F1420 Cocaine dependence, uncomplicated: Secondary | ICD-10-CM

## 2024-06-09 DIAGNOSIS — F1423 Cocaine dependence with withdrawal: Secondary | ICD-10-CM

## 2024-06-09 DIAGNOSIS — F1020 Alcohol dependence, uncomplicated: Secondary | ICD-10-CM

## 2024-06-09 LAB — URINALYSIS, ROUTINE W REFLEX MICROSCOPIC
Bilirubin Urine: NEGATIVE
Glucose, UA: NEGATIVE mg/dL
Hgb urine dipstick: NEGATIVE
Ketones, ur: NEGATIVE mg/dL
Leukocytes,Ua: NEGATIVE
Nitrite: NEGATIVE
Protein, ur: NEGATIVE mg/dL
Specific Gravity, Urine: 1.015 (ref 1.005–1.030)
pH: 5 (ref 5.0–8.0)

## 2024-06-09 LAB — RAPID URINE DRUG SCREEN, HOSP PERFORMED
Amphetamines: NOT DETECTED
Barbiturates: NOT DETECTED
Benzodiazepines: POSITIVE — AB
Cocaine: POSITIVE — AB
Opiates: NOT DETECTED
Tetrahydrocannabinol: NOT DETECTED

## 2024-06-09 MED ORDER — MAGNESIUM HYDROXIDE 400 MG/5ML PO SUSP: 30.0000 mL | Freq: Every day | ORAL | Status: DC | PRN

## 2024-06-09 MED ORDER — LORAZEPAM 2 MG/ML IJ SOLN: 2.0000 mg | Freq: Three times a day (TID) | INTRAMUSCULAR | Status: DC | PRN

## 2024-06-09 MED ORDER — ALUM & MAG HYDROXIDE-SIMETH 200-200-20 MG/5ML PO SUSP: 30.0000 mL | ORAL | Status: DC | PRN

## 2024-06-09 MED ORDER — THIAMINE HCL 100 MG/ML IJ SOLN: 100.0000 mg | Freq: Every day | INTRAMUSCULAR | Status: DC

## 2024-06-09 MED ORDER — NICOTINE 14 MG/24HR TD PT24
14.0000 mg | MEDICATED_PATCH | Freq: Every day | TRANSDERMAL | Status: DC
  Administered 2024-06-09 – 2024-06-11 (×2): 14 mg via TRANSDERMAL
  Filled 2024-06-09 (×3): qty 1

## 2024-06-09 MED ORDER — LORAZEPAM 1 MG PO TABS: 1.0000 mg | ORAL_TABLET | Freq: Two times a day (BID) | ORAL | Status: DC

## 2024-06-09 MED ORDER — DIPHENHYDRAMINE HCL 50 MG/ML IJ SOLN: 50.0000 mg | Freq: Three times a day (TID) | INTRAMUSCULAR | Status: DC | PRN

## 2024-06-09 MED ORDER — ATORVASTATIN CALCIUM 40 MG PO TABS
40.0000 mg | ORAL_TABLET | Freq: Every day | ORAL | Status: DC
  Administered 2024-06-09 – 2024-06-10 (×2): 40 mg via ORAL
  Filled 2024-06-09 (×2): qty 1

## 2024-06-09 MED ORDER — LORAZEPAM 1 MG PO TABS: 0.0000 mg | ORAL_TABLET | Freq: Two times a day (BID) | ORAL | Status: DC

## 2024-06-09 MED ORDER — THIAMINE MONONITRATE 100 MG PO TABS: 100.0000 mg | ORAL_TABLET | Freq: Every day | ORAL | Status: DC

## 2024-06-09 MED ORDER — LORAZEPAM 1 MG PO TABS: 1.0000 mg | ORAL_TABLET | Freq: Four times a day (QID) | ORAL | Status: DC | PRN

## 2024-06-09 MED ORDER — HALOPERIDOL 5 MG PO TABS: 5.0000 mg | ORAL_TABLET | Freq: Three times a day (TID) | ORAL | Status: DC | PRN

## 2024-06-09 MED ORDER — LORAZEPAM 1 MG PO TABS
1.0000 mg | ORAL_TABLET | Freq: Four times a day (QID) | ORAL | Status: AC
  Administered 2024-06-09 – 2024-06-10 (×5): 1 mg via ORAL
  Filled 2024-06-09 (×5): qty 1

## 2024-06-09 MED ORDER — LORAZEPAM 1 MG PO TABS: 0.0000 mg | ORAL_TABLET | Freq: Four times a day (QID) | ORAL | Status: DC

## 2024-06-09 MED ORDER — ADULT MULTIVITAMIN W/MINERALS CH: 1.0000 | ORAL_TABLET | Freq: Every day | ORAL | Status: DC

## 2024-06-09 MED ORDER — HALOPERIDOL LACTATE 5 MG/ML IJ SOLN: 5.0000 mg | Freq: Three times a day (TID) | INTRAMUSCULAR | Status: DC | PRN

## 2024-06-09 MED ORDER — HALOPERIDOL LACTATE 5 MG/ML IJ SOLN: 10.0000 mg | Freq: Three times a day (TID) | INTRAMUSCULAR | Status: DC | PRN

## 2024-06-09 MED ORDER — THIAMINE HCL 100 MG/ML IJ SOLN
100.0000 mg | Freq: Once | INTRAMUSCULAR | Status: AC
  Administered 2024-06-09: 100 mg via INTRAMUSCULAR
  Filled 2024-06-09: qty 2

## 2024-06-09 MED ORDER — ACETAMINOPHEN 325 MG PO TABS: 650.0000 mg | ORAL_TABLET | Freq: Four times a day (QID) | ORAL | Status: DC | PRN

## 2024-06-09 MED ORDER — DIPHENHYDRAMINE HCL 50 MG PO CAPS: 50.0000 mg | ORAL_CAPSULE | Freq: Three times a day (TID) | ORAL | Status: DC | PRN

## 2024-06-09 MED ORDER — ADULT MULTIVITAMIN W/MINERALS CH
1.0000 | ORAL_TABLET | Freq: Every day | ORAL | Status: DC
  Administered 2024-06-10 – 2024-06-11 (×2): 1 via ORAL
  Filled 2024-06-09 (×2): qty 1

## 2024-06-09 MED ORDER — THIAMINE MONONITRATE 100 MG PO TABS
100.0000 mg | ORAL_TABLET | Freq: Every day | ORAL | Status: DC
  Administered 2024-06-10 – 2024-06-11 (×2): 100 mg via ORAL
  Filled 2024-06-09 (×2): qty 1

## 2024-06-09 MED ORDER — METFORMIN HCL ER 500 MG PO TB24
500.0000 mg | ORAL_TABLET | Freq: Two times a day (BID) | ORAL | Status: DC
  Administered 2024-06-09 – 2024-06-11 (×4): 500 mg via ORAL
  Filled 2024-06-09 (×4): qty 1

## 2024-06-09 MED ORDER — TRAZODONE HCL 50 MG PO TABS
50.0000 mg | ORAL_TABLET | Freq: Every evening | ORAL | Status: DC | PRN
  Administered 2024-06-09: 50 mg via ORAL
  Filled 2024-06-09: qty 1

## 2024-06-09 NOTE — ED Provider Notes (Signed)
 Patient has been evaluated by TTS team.  He has been accepted to [facility] behavioral health facility for treatment of polysubstance use.  Accepting MD is Zouev.  EMTALA completed.  Will arrange for safe transport as his bed is available now   Pamella Ozell LABOR, DO 06/09/24 1007

## 2024-06-09 NOTE — ED Notes (Signed)
 TTS in process

## 2024-06-09 NOTE — Group Note (Signed)
 Group Topic: Wellness  Group Date: 06/09/2024 Start Time: 1500 End Time: 1530 Facilitators: Sherleen Primmer, RN  Department: [facility]  Number of Participants: 4  Group Focus: goals/reality orientation, relapse prevention, and self-awareness Treatment Modality:  Psychoeducation Interventions utilized were patient education, problem solving, and support Purpose: enhance coping skills and increase insight  Name: Chase Higgins Date of Birth: [birth year]  MR: [id number]    Level of Participation: Pt did not attend group. Quality of Participation: n/a Interactions with others: n/a Mood/Affect: n/a Triggers (if applicable): n/a Cognition: n/a Progress: Other Response: n/a Plan: follow-up needed  Patients Problems:  Patient Active Problem List   Diagnosis Date Noted   Substance induced mood disorder (HCC) 06/09/2024   Cocaine use disorder, severe, dependence (HCC) 06/09/2024   Alcohol use disorder, severe, dependence (HCC) 06/09/2024   Cannabis use disorder, severe, dependence (HCC) 06/09/2024   Cough 11/10/2018   Smoker 11/10/2018   Lower respiratory infection 11/10/2018   Chest congestion 11/10/2018   Gout attack 04/10/2014   Calcium pyrophosphate arthropathy 02/01/2014   Diabetes mellitus, type 2 (HCC) 01/31/2014   Essential hypertension, benign 01/31/2014   Hypertriglyceridemia 01/31/2014

## 2024-06-09 NOTE — ED Provider Notes (Signed)
 Emergency Medicine Observation Re-evaluation Note  Chase Higgins is a 53 y.o. male, seen on rounds today.  Pt initially presented to the ED for complaints of cocaine/SUD and related mood disorder. No new c/o this AM, up and about in ED.   Physical Exam  BP (!) 138/102   Pulse 75   Temp 98.1 F (36.7 C)   Resp 15   SpO2 100%  Physical Exam General: calm.  Cardiac: regular rate Lungs: breathing comfortably. Psych: calm, normal mood/affect.   ED Course / MDM    I have reviewed the labs performed to date as well as medications administered while in observation.  Recent changes in the last 24 hours include ED obs, reassessment.   Plan  [facility] team following, dispo per [facility] team.    Bernard Drivers, MD 06/09/24 [phone]

## 2024-06-09 NOTE — ED Notes (Signed)
 Patient is in the bedroom calm and sleeping with eyes closed NAD.  Respirations are even and unlabored. Will monitor for safety.

## 2024-06-09 NOTE — Group Note (Signed)
 Group Topic: Social Support  Group Date: 06/09/2024 Start Time: 2000 End Time: 2030 Facilitators: Floy Morene RAMAN, NT  Department: [facility]  Number of Participants: 8  Group Focus: check in Treatment Modality:  Individual Therapy Interventions utilized were leisure development and support Purpose: express feelings  Name: Chase Higgins Date of Birth: [birth year]  MR: [id number]    Level of Participation: none Quality of Participation: none Interactions with others: none Mood/Affect: none Triggers (if applicable): N.A Cognition: N.A.  Progress: None Response: none Plan: patient will be encouraged to attend more groups.   Patients Problems:  Patient Active Problem List   Diagnosis Date Noted   Substance induced mood disorder (HCC) 06/09/2024   Cocaine use disorder, severe, dependence (HCC) 06/09/2024   Alcohol use disorder, severe, dependence (HCC) 06/09/2024   Cannabis use disorder, severe, dependence (HCC) 06/09/2024   Cough 11/10/2018   Smoker 11/10/2018   Lower respiratory infection 11/10/2018   Chest congestion 11/10/2018   Gout attack 04/10/2014   Calcium pyrophosphate arthropathy 02/01/2014   Diabetes mellitus, type 2 (HCC) 01/31/2014   Essential hypertension, benign 01/31/2014   Hypertriglyceridemia 01/31/2014

## 2024-06-09 NOTE — Consult Note (Addendum)
 [facility] Psychiatric Consult Initial  Patient Name: .Chase Higgins  MRN: [id number]  DOB: 02-Nov-1971  Consult Order details:  Orders (From admission, onward)     Start     Ordered   06/08/24 2302  CONSULT TO CALL ACT TEAM       Ordering Provider: Franklyn Sid SAILOR, MD  Provider:  (Not yet assigned)  Question:  Reason for Consult?  Answer:  Psych consult   06/08/24 2301             Mode of Visit: In person    Psychiatry Consult Evaluation  Service Date: June 09, 2024 LOS:  LOS: 0 days  Chief Complaint: I really need to get some help   Primary Psychiatric Diagnoses  Substance-induced mood disorder 2.   Cocaine use disorder severe dependence 3.   Alcohol use disorder severe dependence 4.   Cannabis use disorder severe dependence  Assessment   Chase Higgins is a 53 y.o. male with a past psychiatric history of cocaine abuse, EtOH abuse, and cannabis abuse, with pertinent medical comorbidities/history that include hypertriglyceridemia, chronic pain, essential hypertension, and diabetes type 2 not on insulin, who presented this encounter by way of self with endorsements of a need for substance abuse treatment, in the context of abuse of alcohol, cocaine, and cannabis, as well as decompensation of the patient's mental health into experiencing of suicidal ideations, in the context of aforementioned substance abuse, and psychosocial stressors.  Patient is currently medically clear, per EDP team, as well as voluntary.  Upon evaluation, patient presents with symptomology that is most consistent with a substance-induced mood disorder and a severe cocaine, alcohol, and cannabis use disorder with dependence.  Evidence of this is appreciable from evaluation conducted, and chart review, of which demonstrate a severe abuse pattern with consequences and development of toleration, as well as the development of decompensation of the patient's mental health, given substance abuse and psychosocial  stressors.  Given the patient's endorsements of a desire to address his substance abuse, endorsements of depressive and anxious symptomology, and endorsements of suicidal ideations, recommendation is for inpatient mental health hospitalization, for safety and stabilization of the patient.  Through shared decision making discussion, patient endorsed that he was not interested in starting psychopharmacological intervention to address depressive and anxious symptomology at this time, states that he wants to attend detox and rehabilitation services before considering medications for the aforementioned.   Diagnoses:  Active Hospital problems: Principal Problem:   Substance induced mood disorder (HCC) Active Problems:   Cocaine use disorder, severe, dependence (HCC)   Alcohol use disorder, severe, dependence (HCC)   Cannabis use disorder, severe, dependence (HCC)    Plan   #Substance induced mood disorder (HCC) #Cocaine use disorder, severe, dependence (HCC) #Alcohol use disorder, severe, dependence (HCC) #Cannabis use disorder, severe, dependence (HCC)  ## Psychiatric Medication Recommendations:   - Recommend CIWA protocols - Recommend Lexapro 5 mg p.o. daily, if/when patient is amenable  ## Medical Decision Making Capacity: Not specifically addressed in this encounter  ## Further Work-up: Repeat EKG for QTc monitor; recent EKG appreciably elevated and QTc  ## Disposition:-- We recommend inpatient psychiatric hospitalization when medically cleared. Patient is under voluntary admission status at this time; please IVC if attempts to leave hospital.  ## Behavioral / Environmental: -Routine agitation/safety precautions; CIWA monitoring    ## Safety and Observation Level:  - Based on my clinical evaluation, I estimate the patient to be at low risk of self harm in the current setting. -  At this time, we recommend  routine. This decision is based on my review of the chart including  patient's history and current presentation, interview of the patient, mental status examination, and consideration of suicide risk including evaluating suicidal ideation, plan, intent, suicidal or self-harm behaviors, risk factors, and protective factors. This judgment is based on our ability to directly address suicide risk, implement suicide prevention strategies, and develop a safety plan while the patient is in the clinical setting. Please contact our team if there is a concern that risk level has changed.  CSSR Risk Category:C-SSRS RISK CATEGORY: High Risk  Suicide Risk Assessment: Patient has following modifiable risk factors for suicide: untreated depression, recklessness, medication noncompliance, lack of access to outpatient mental health resources, active mental illness (to encompass adhd, tbi, mania, psychosis, trauma reaction), current symptoms: anxiety/panic, insomnia, impulsivity, anhedonia, hopelessness, and triggering events, which we are addressing by treatment recommendations. Patient has following non-modifiable or demographic risk factors for suicide: male gender and separation or divorce Patient has the following protective factors against suicide: Frustration tolerance, no history of suicide attempts, and no history of NSSIB  Thank you for this consult request. Recommendations have been communicated to the primary team.  We will continue to follow at this time.   Chase JINNY Gravely, NP     History of Present Illness   Chase Higgins is a 53 y.o. male with a past psychiatric history of cocaine abuse, EtOH abuse, and cannabis abuse, with pertinent medical comorbidities/history that include hypertriglyceridemia, chronic pain, essential hypertension, and diabetes type 2 not on insulin, who presented this encounter by way of self with endorsements of a need for substance abuse treatment, in the context of abuse of alcohol, cocaine, and cannabis, as well as decompensation of the patient's  mental health into experiencing of suicidal ideations, in the context of aforementioned substance abuse, and psychosocial stressors.  Patient is currently medically clear, per EDP team, as well as voluntary.  Patient seen today at the [facility] emergency department for face-to-face psychiatric evaluation.  Upon evaluation, patient endorses that he came to the hospital because, I really need to get some help, I've been just going straight down hill man.  Expanding on this, patient endorses that since March of this year he has been homeless, states that he was living with his mother, father, and two sisters in stable housing for a long time to get back on my feet from my divorce, but because of his inability to control his substance abuse, states that his family has recently, said they are tired of all of it, and proceeded to kick him out.  Patient endorses that he has been struggling with crack cocaine and cannabis addiction since about 1995, but after his marriage fell apart, and he and his wife legally separated around October 2024, and he started dating a trash alcoholic, states that he has been struggling also with EtOH abuse ever since.  Expanding on illicit cocaine and cannabis use, patient endorses that lately, for about the last several months, has been using cocaine and cannabis daily in the amount of, about an eight ball a day of crack and I'd say about a couple grams of pot a day, but it all depends on what money I can pull together; expands and describes that he is not working, and to support his habit, performs illegal activities.   Expanding on historical abuse of the aforementioned, patient endorses that on and off over the years he has used crack cocaine and  cannabis very frequently, but only daily during certain periods, of which includes currently. Patient endorses however last cocaine and cannabis use was appreciably 4 days ago.   Patient denies any other drug use,  even when confronted on urine drug screen which states that the patient had benzodiazepines in his system, stating, the crack I have been taking must have had some in it, I am honestly not sure; UDS appreciably positive for benzodiazepines and cocaine, not positive for cannabis. BAL unremarkable.  Prompted to expand on EtOH use and abuse, patient endorses again that since around October of last year, has been drinking excessive amounts of alcohol almost daily, when I can get my hands on money for it.  Prompted to further expand on his EtOH abuse, patient endorses that during periods of having a lot of money available to him, states that regularly since last year it is not uncommon for him to drink several beers and some liquor per day, but recently, due to abruptly being kicked out by family, and now experiencing homelessness, has been drinking more infrequently, at about, mostly here and there now, because I spend most of all my money on the other stuff (referring to crack and cannabis).  Patient endorses daily tobacco use.  Patient endorses that he has never been inpatient mental health hospitalized, but has attended rehabilitation services for EtOH and illicit substance abuse 5 times, with most recently attending [facility] around a month and a half ago, but states that he could not tolerate it, and left after 4 days to go back to using.  Patient endorses no psychiatric history, outside of alcohol and illicit substance abuse.  Patient endorses no history of psychiatric medication use.  Patient endorses no history of outpatient mental health and/or therapy use.  Patient endorses no history of suicide attempts and/or self-injurious behavior.  Patient endorses no current withdrawal symptomology, outside of malaise/fatigue, and upon physical exam, no appreciable withdrawal symptomology appreciated.  Patient orientation is intact, no concerns or fluctuations in consciousness.  Patient denies auditory  and or visual hallucinations, and objectively, does not appear to be presenting with psychotic features.  Patient endorses no history of medical detox, withdrawal seizures, and/or complications from illicit substance abuse and/or EtOH abuse.  Patient endorses he has not been sleeping and eating well, given homelessness and severe substance abuse.  Patient endorses his mood as depressed, and presents with fair eye contact, and a withdrawn interpersonal style.  Patient endorses passive suicidal ideations, but with appreciably no plan.  Patient endorses no homicidal ideations.  Patient denied any other psychosocial stressors, outside of new onset homelessness and inability to refrain from using illicit substances and EtOH.  Discussed with patient recommendation for inpatient mental health hospitalization, for addressing his substance abuse and mental health, to which patient endorsed that he was amenable to this.  No questions or concerns after discussion. Denies desire to utilize medications for depression at this time.   Review of Systems  Constitutional:  Positive for malaise/fatigue and weight loss.  Gastrointestinal:  Negative for abdominal pain, constipation, diarrhea, nausea and vomiting.  Musculoskeletal:  Negative for myalgias.  Neurological:  Negative for dizziness, tingling, tremors, seizures, loss of consciousness, weakness and headaches.  Psychiatric/Behavioral:  Positive for depression, substance abuse (ETOH, crack cocaine, and THC) and suicidal ideas (Passive thoughts). Negative for hallucinations. The patient has insomnia. The patient is not nervous/anxious.   All other systems reviewed and are negative.    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 Living Situation: Homeless since March Spiritual Hx: None Access to weapons/lethal means: None endorsed  Substance History Alcohol: Yes Type of alcohol: Liquor and beer Last Drink: Prior to this encounter yesterday evening Number of drinks per day: Varies History of alcohol withdrawal seizures: None History of DT's : None Tobacco: Yes, does not clarify yes, Illicit drugs: Cocaine, cannabis, history of unintentional opiate use, UDS positive for benzodiazepines but denies Prescription drug abuse: None endorsed Rehab hx: Yes, x 5, most [facility]   Exam Findings  Physical Exam: As below Vital Signs:  Temp:  [98.1 F (36.7 C)] 98.1 F (36.7 C) (07/02 1826) Pulse Rate:  [72-83] 75 (07/03 0000) Resp:  [15-20] 15 (07/03 0000) BP: (119-138)/(94-102) 138/102 (07/03 0000) SpO2:  [98 %-100 %] 100 % (07/03 0000) Blood pressure (!) 138/102, pulse 75, temperature 98.1 F (36.7 C), resp. rate 15, SpO2 100%. There is no height or weight on file to calculate BMI.  Physical Exam Vitals and nursing note reviewed.  Constitutional:      General: He is not in acute distress.    Appearance: He is normal weight. He is not ill-appearing, toxic-appearing or diaphoretic.     Comments: Withdrawn interpersonal style  Pulmonary:     Effort: Pulmonary effort is normal.  Skin:    General: Skin is warm and dry.  Neurological:     Mental Status: He is alert and oriented to person, place, and time.     Motor: No weakness, tremor or seizure activity.  Psychiatric:        Attention and Perception: Attention and perception normal. He does not perceive auditory or visual hallucinations.        Mood and Affect: Mood is depressed.        Speech: Speech  normal.        Behavior: Behavior is withdrawn. Behavior is cooperative.        Thought Content: Thought content is not paranoid or delusional. Thought content includes suicidal ideation. Thought content does not include homicidal ideation. Thought content does not include suicidal plan.        Cognition and Memory: Cognition and memory normal.        Judgment: Judgment normal.     Comments: Affect: neutral to mildly constricted    Mental Status Exam: General Appearance: Mildly unkept Asian American male with withdrawn interpersonal style  Orientation:  Full (Time, Place, and Person)  Memory:  WDL  Concentration:  Concentration: Fair and Attention Span: Fair  Recall:  Fair  Attention  Fair  Eye Contact:  Fair  Speech:  Clear and Coherent and Normal Rate  Language:  Fair  Volume:  Normal  Mood: Depressed  Affect:  neutral to constricted  Thought Process:  Coherent, Goal Directed, and Linear  Thought Content:  Negative and Logical  Suicidal Thoughts:  Yes.  without intent/plan  Homicidal Thoughts:  No  Judgement:  Poor  Insight:  Lacking, Present, and Shallow  Psychomotor Activity:  Normal  Akathisia:  No  Fund of Knowledge:  Fair      Assets:  Communication Skills Desire for Improvement Physical Health Resilience Talents/Skills Vocational/Educational  Cognition:  WNL  ADL's:  Intact  AIMS (if indicated):   0     Other History   These have been pulled in through the EMR, reviewed, and updated if appropriate.  Family History:  The patient's family history includes Diabetes in his father; Hyperlipidemia in his mother; Hypertension in his mother; Stroke in his father.  Medical History: Past Medical History:  Diagnosis Date   Arthritis    Diabetes mellitus without complication (HCC)    Gout     Surgical History: History reviewed. No pertinent surgical history.   Medications:   Current Facility-Administered Medications:    LORazepam (ATIVAN) tablet 1 mg, 1 mg,  Oral, Q6H **OR** LORazepam (ATIVAN) tablet 0-4 mg, 0-4 mg, Oral, Q6H, Franklyn Sid SAILOR, MD   [START ON 06/11/2024] LORazepam (ATIVAN) tablet 1 mg, 1 mg, Oral, Q12H **OR** [START ON 06/11/2024] LORazepam (ATIVAN) tablet 0-4 mg, 0-4 mg, Oral, Q12H, Naasz, Hayley N, MD   thiamine (VITAMIN B1) tablet 100 mg, 100 mg, Oral, Daily, 100 mg at 06/08/24 2231 **OR** thiamine (VITAMIN B1) injection 100 mg, 100 mg, Intravenous, Daily, Franklyn Sid SAILOR, MD  Current Outpatient Medications:    azithromycin  (ZITHROMAX ) 250 MG tablet, Sig as indicated (Patient not taking: Reported on 11/13/2020), Disp: 6 tablet, Rfl: 0   benzonatate  (TESSALON ) 200 MG capsule, Take 1 capsule (200 mg total) by mouth 2 (two) times daily as needed for cough. (Patient not taking: Reported on 11/13/2020), Disp: 20 capsule, Rfl: 0   Chlorphen-Pseudoephed-APAP (THERAFLU FLU/COLD PO), Take by mouth 2 (two) times daily. (Patient not taking: Reported on 11/13/2020), Disp: , Rfl:    colchicine  0.6 MG tablet, Take 1 tablet (0.6 mg total) by mouth 2 (two) times daily. (Patient not taking: Reported on 11/10/2018), Disp: 10 tablet, Rfl: 3   fenofibrate  (TRICOR ) 145 MG tablet, Take 1 tablet (145 mg total) by mouth daily. (Patient not taking: Reported on 11/10/2018), Disp: 30 tablet, Rfl: 5   HYDROcodone -acetaminophen  (NORCO/VICODIN) 5-325 MG tablet, Take 1 tablet by mouth every 6 (six) hours as needed., Disp: 6 tablet, Rfl: 0   ibuprofen  (ADVIL ,MOTRIN ) 800 MG tablet, Take 1 tablet (800 mg total) by mouth 3 (three) times daily. (Patient not taking: Reported on 11/10/2018), Disp: 21 tablet, Rfl: 0   lidocaine  (LIDODERM ) 5 %, Place 1 patch onto the skin daily. Remove & Discard patch within 12 hours or as directed by MD, Disp: 30 patch, Rfl: 0  Allergies: No Known Allergies  Chase JINNY Gravely, NP

## 2024-06-09 NOTE — Discharge Planning (Signed)
 LCSW met with patient to assess current mood, affect, physical state, and inquire about needs/goals while here in [facility] and after discharge. Patient reports he presented due to cocaine and alcohol use disorder.  Patient reports he has been using cocaine and alcohol daily and reports a lot and could not indicate quantity. Patient reports being homeless for the past 2 weeks since leaving his parents home where he has lived.   Patient reports first use of alcohol at age 6 and cocaine use reported as real bad since 31. Patient reported method of smoking cocaine and drinking beer and liquor.   Patient reported being unemployed and unable to work due to addiction. 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 Patient stated he was caught shoplifting laundry detergent in [place] and has a court date on July 23  And spent 5 days in jail. Patient indicated he was incarcerated before but would not elaborate.   Patient reports driving and no problem transportation. Patient reported having been to [facility] in past and also several residential programs such as [facility], [facility], [employer]. Patient reports his current goal is to seek residential placement for substance use. Patient has private insurance and is agreeable to try to get into [facility] or [facility]. We discussed the ability for residential treatment program to write a letter for his absence on court date.   Patient currently denies any SI/HI/AVH and mood is calm and cooperative. Patient aware that LCSW will send referrals out for review and will follow up to provide updates as received. Patient expressed understanding and appreciation of LCSW assistance. No other needs were reported at this time by patient.

## 2024-06-09 NOTE — ED Notes (Signed)
 Patient sleeping at the moment. NAD. Will continue to monitor for safety.

## 2024-06-09 NOTE — BH Assessment (Signed)
 TTS clinician attempted to complete assessment. Patient is visibly asleep and would not wake up after multiple attempts by RN. Monique, RN, informed of patient status. TTS consult will be completed when patient is alert and oriented and able to engage.

## 2024-06-09 NOTE — ED Provider Notes (Signed)
 Facility Based Crisis Admission H&P  Date: 06/09/24 Patient Name: Chase Higgins MRN: [id number] Chief Complaint: I Need help  Diagnoses:  Final diagnoses:  Cocaine dependence with withdrawal (HCC)  Uncomplicated alcohol dependence (HCC)  Substance induced mood disorder (HCC)    HPI: 53 y.o. male with a past psychiatric history of cocaine abuse, EtOH abuse, and cannabis abuse and PMH of  hypertriglyceridemia, chronic pain, essential hypertension, and diabetes type 2 not on insulin, who presented to ED with depression and SI requesting substance abuse treatment, in the context of abuse of alcohol, cocaine, and cannabis.   per NP evaluation earlier today in the ER: Upon evaluation, patient endorses that he came to the hospital because, I really need to get some help, I've been just going straight down hill man.  Expanding on this, patient endorses that since March of this year he has been homeless, states that he was living with his mother, father, and two sisters in stable housing for a long time to get back on my feet from my divorce, but because of his inability to control his substance abuse, states that his family has recently, said they are tired of all of it, and proceeded to kick him out.Patient endorses that he has been struggling with crack cocaine and cannabis addiction since about 1995, but after his marriage fell apart, and he and his wife legally separated around October 2024, and he started dating a trash alcoholic, states that he has been struggling also with EtOH abuse ever since.Expanding on illicit cocaine and cannabis use, patient endorses that lately, for about the last several months, has been using cocaine and cannabis daily in the amount of, about an eight ball a day of crack and I'd say about a couple grams of pot a day, but it all depends on what money I can pull together; expands and describes that he is not working, and to support his habit, performs  illegal activities. Expanding on historical abuse of the aforementioned, patient endorses that on and off over the years he has used crack cocaine and cannabis very frequently, but only daily during certain periods, of which includes currently. Patient endorses however last cocaine and cannabis use was appreciably 4 days ago. Patient denies any other drug use, even when confronted on urine drug screen which states that the patient had benzodiazepines in his system, stating, the crack I have been taking must have had some in it, I am honestly not sure; UDS appreciably positive for benzodiazepines and cocaine, not positive for cannabis. BAL unremarkable. Prompted to expand on EtOH use and abuse, patient endorses again that since around October of last year, has been drinking excessive amounts of alcohol almost daily, when I can get my hands on money for it.  Prompted to further expand on his EtOH abuse, patient endorses that during periods of having a lot of money available to him, states that regularly since last year it is not uncommon for him to drink several beers and some liquor per day, but recently, due to abruptly being kicked out by family, and now experiencing homelessness, has been drinking more infrequently, at about, mostly here and there now, because I spend most of all my money on the other stuff (referring to crack and cannabis).  Patient endorses daily tobacco use.Patient endorses that he has never been inpatient mental health hospitalized, but has attended rehabilitation services for EtOH and illicit substance abuse 5 times, with most recently attending [facility] around a month and a half  ago, but states that he could not tolerate it, and left after 4 days to go back to using.  Patient endorses no psychiatric history, outside of alcohol and illicit substance abuse.  Patient endorses no history of psychiatric medication use.  Patient endorses no history of outpatient mental health  and/or therapy use.  Patient endorses no history of suicide attempts and/or self-injurious behavior. Patient endorses no current withdrawal symptomology, outside of malaise/fatigue, and upon physical exam, no appreciable withdrawal symptomology appreciated.  Patient orientation is intact, no concerns or fluctuations in consciousness.  Patient denies auditory and or visual hallucinations, and objectively, does not appear to be presenting with psychotic features.  Patient endorses no history of medical detox, withdrawal seizures, and/or complications from illicit substance abuse and/or EtOH abuse.Patient endorses he has not been sleeping and eating well, given homelessness and severe substance abuse.  Patient endorses his mood as depressed, and presents with fair eye contact, and a withdrawn interpersonal style.  Patient endorses passive suicidal ideations, but with appreciably no plan.  Patient endorses no homicidal ideations.  Patient denied any other psychosocial stressors, outside of new onset homelessness and inability to refrain from using illicit substances and EtOH.Discussed with patient recommendation for inpatient mental health hospitalization, for addressing his substance abuse and mental health, to which patient endorsed that he was amenable to this.  No questions or concerns after discussion. Denies desire to utilize medications for depression at this time.  Patient seen at bedside and confirms all of above information, endorsing daily cocaine, cannabis and alcohol abuse. Patient reports he has been kicked out by family and is homeless since March. States he was feeling depressed the past week and hopeless but states I feel better today and denies SI, HI or AVH. States he does not usually struggle with consistent depressive symptoms and declines starting an antidepressant. States I thought I was just coming her to detox. Confirms daily substance abuse as noted above. Denies symptoms of mania,  psychosis. Denies PTSD. Denies prior inpatient psychiatric admissions or suicide attempts. PHQ 2-9:   Flowsheet Row ED from 06/09/2024 in [facility] ED from 06/08/2024 in [facility] Department at [facility] from 04/06/2024 in [facility]  C-SSRS RISK CATEGORY High Risk High Risk No Risk    Screenings    Flowsheet Row Most Recent Value  CIWA-Ar Total 3    Total Time spent with patient: 45 minutes  Musculoskeletal  Strength & Muscle Tone: within normal limits Gait & Station: normal Patient leans: N/A  Psychiatric Specialty Exam  Presentation General Appearance:  Disheveled  Eye Contact: Fair  Speech: Slow; Clear and Coherent  Speech Volume: Decreased  Handedness:No data recorded  Mood and Affect  Mood: Depressed; Dysphoric; Anxious  Affect: Congruent   Thought Process  Thought Processes: Coherent  Descriptions of Associations:Intact  Orientation:Full (Time, Place and Person)  Thought Content:Logical    Hallucinations:Hallucinations: None  Ideas of Reference:None  Suicidal Thoughts: patient denies SI to me today  Homicidal Thoughts:Homicidal Thoughts: No   Sensorium  Memory: Immediate Fair  Judgment: Fair  Insight: Fair   Art therapist  Concentration: Fair  Attention Span: Fair  Recall: [employer] of Knowledge: Fair  Language: Fair   Psychomotor Activity  Psychomotor Activity: Psychomotor Activity: Normal   Assets  Assets: Communication Skills; Desire for Improvement; Physical Health; Resilience   Sleep  Sleep: Sleep: Fair   No data recorded  Physical exam:  General:  Well developed, well nourished, Asian male.  Pupils: Normal at 3mm Respiratory: Breathing is unlabored.  Cardiovascular: No edema.  Language: No anomia, no aphasia Muscle strength and tone-pt moving all extremities.  Gait not assessed as pt remained  in bed.  Neuro: Facial muscles are symmetric. Pt without tremor, no evidence of hyperarousal.  Review of Systems  Constitutional: Negative.   HENT: Negative.    Eyes: Negative.   Respiratory: Negative.    Cardiovascular: Negative.   Gastrointestinal: Negative.   Genitourinary: Negative.   Musculoskeletal: Negative.   Skin: Negative.   Neurological: Negative.   Endo/Heme/Allergies: Negative.   Psychiatric/Behavioral:  Positive for depression, substance abuse and suicidal ideas. The patient is nervous/anxious and has insomnia.     Blood pressure 110/82, pulse 75, temperature 98 F (36.7 C), temperature source Oral, resp. rate 16, SpO2 97%. There is no height or weight on file to calculate BMI.  Past Psychiatric History:  Denies prior medications for depression or seeing a psychiatrist Denies any prior suicide attempts   Substance abuse history Alcohol: Yes Type of alcohol: Liquor and beer Last Drink: Prior to this encounter yesterday evening Number of drinks per day: Varies History of alcohol withdrawal seizures: None History of DT's : None Tobacco: Yes, does not clarify yes, Illicit drugs: Cocaine, cannabis, history of unintentional opiate use, UDS positive for benzodiazepines but denies Prescription drug abuse: None endorsed Rehab hx: Yes, x [facility]   Is the patient at risk to self? Yes  Has the patient been a risk to self in the past 6 months? Yes .    Has the patient been a risk to self within the distant past? Yes   Is the patient a risk to others? No   Has the patient been a risk to others in the past 6 months? No   Has the patient been a risk to others within the distant past? No   Past Medical History: type 2 DM, arthritis, gout, is not taking any medications Family History:The patient's family history includesDiabetes in his father; Hyperlipidemia in his mother; Hypertension in his mother; Stroke in his father. Denies mental health history or  suicide Social History: unemployed homeless after being kicked out by family due to substance abuse  Last Labs:  Admission on 06/08/2024, Discharged on 06/09/2024  Component Date Value Ref Range Status   WBC 06/08/2024 9.7  4.0 - 10.5 K/uL Final   RBC 06/08/2024 5.31  4.22 - 5.81 MIL/uL Final   Hemoglobin 06/08/2024 16.8  13.0 - 17.0 g/dL Final   HCT 92/97/7974 50.0  39.0 - 52.0 % Final   MCV 06/08/2024 94.2  80.0 - 100.0 fL Final   MCH 06/08/2024 31.6  26.0 - 34.0 pg Final   MCHC 06/08/2024 33.6  30.0 - 36.0 g/dL Final   RDW 92/97/7974 14.0  11.5 - 15.5 % Final   Platelets 06/08/2024 324  150 - 400 K/uL Final   nRBC 06/08/2024 0.0  0.0 - 0.2 % Final   Neutrophils Relative % 06/08/2024 61  % Final   Neutro Abs 06/08/2024 6.0  1.7 - 7.7 K/uL Final   Lymphocytes Relative 06/08/2024 20  % Final   Lymphs Abs 06/08/2024 1.9  0.7 - 4.0 K/uL Final   Monocytes Relative 06/08/2024 9  % Final   Monocytes Absolute 06/08/2024 0.9  0.1 - 1.0 K/uL Final   Eosinophils Relative 06/08/2024 8  % Final   Eosinophils Absolute 06/08/2024 0.7 (H)  0.0 - 0.5 K/uL  Final   Basophils Relative 06/08/2024 1  % Final   Basophils Absolute 06/08/2024 0.1  0.0 - 0.1 K/uL Final   Immature Granulocytes 06/08/2024 1  % Final   Abs Immature Granulocytes 06/08/2024 0.08 (H)  0.00 - 0.07 K/uL Final   Performed at [facility] Lab, [address], [place], [place] [address]   Salicylate Lvl 06/08/2024 <7.0 (L)  7.0 - 30.0 mg/dL Final   Performed at [facility], [address], [place], [place] [address]   Acetaminophen  (Tylenol ), Serum 06/08/2024 <10 (L)  10 - 30 ug/mL Final   Comment: (NOTE) Therapeutic concentrations vary significantly. A range of 10-30 ug/mL  may be an effective concentration for many patients. However, some  are best treated at concentrations outside of this range. Acetaminophen  concentrations >150 ug/mL at 4 hours after ingestion  and >50 ug/mL at 12 hours after ingestion are often  associated with  toxic reactions.  Performed at [facility] Lab, [address] [place], [place] [address]    Sodium 06/08/2024 139  135 - 145 mmol/L Final   Potassium 06/08/2024 3.6  3.5 - 5.1 mmol/L Final   Chloride 06/08/2024 104  98 - 111 mmol/L Final   CO2 06/08/2024 23  22 - 32 mmol/L Final   Glucose, Bld 06/08/2024 111 (H)  70 - 99 mg/dL Final   Glucose reference range applies only to samples taken after fasting for at least 8 hours.   BUN 06/08/2024 11  6 - 20 mg/dL Final   Creatinine, Ser 06/08/2024 1.11  0.61 - 1.24 mg/dL Final   Calcium 92/97/7974 8.9  8.9 - 10.3 mg/dL Final   Total Protein 92/97/7974 6.7  6.5 - 8.1 g/dL Final   Albumin 92/97/7974 3.4 (L)  3.5 - 5.0 g/dL Final   AST 92/97/7974 14 (L)  15 - 41 U/L Final   ALT 06/08/2024 12  0 - 44 U/L Final   Alkaline Phosphatase 06/08/2024 61  38 - 126 U/L Final   Total Bilirubin 06/08/2024 0.6  0.0 - 1.2 mg/dL Final   GFR, Estimated 06/08/2024 >60  >60 mL/min Final   Comment: (NOTE) Calculated using the CKD-EPI Creatinine Equation (2021)    Anion gap 06/08/2024 12  5 - 15 Final   Performed at [facility], [address], [place], [place] [address]   Alcohol, Ethyl (B) 06/08/2024 <15  <15 mg/dL Final   Comment: (NOTE) For medical purposes only. Performed at [facility] Lab, [address], [place], [place] [address]    Opiates 06/09/2024 NONE DETECTED  NONE DETECTED Final   Cocaine 06/09/2024 POSITIVE (A)  NONE DETECTED Final   Benzodiazepines 06/09/2024 POSITIVE (A)  NONE DETECTED Final   Amphetamines 06/09/2024 NONE DETECTED  NONE DETECTED Final   Tetrahydrocannabinol 06/09/2024 NONE DETECTED  NONE DETECTED Final   Barbiturates 06/09/2024 NONE DETECTED  NONE DETECTED Final   Comment: (NOTE) DRUG SCREEN FOR MEDICAL PURPOSES ONLY.  IF CONFIRMATION IS NEEDED FOR ANY PURPOSE, NOTIFY LAB WITHIN 5 DAYS.  LOWEST DETECTABLE LIMITS FOR URINE DRUG SCREEN Drug Class                     Cutoff  (ng/mL) Amphetamine and metabolites    1000 Barbiturate and metabolites    200 Benzodiazepine                 200 Opiates and metabolites        300 Cocaine and metabolites  300 THC                            50 Performed at [facility] Lab, [address] [place], [place] [address]    Color, Urine 06/09/2024 YELLOW  YELLOW Final   APPearance 06/09/2024 CLEAR  CLEAR Final   Specific Gravity, Urine 06/09/2024 1.015  1.005 - 1.030 Final   pH 06/09/2024 5.0  5.0 - 8.0 Final   Glucose, UA 06/09/2024 NEGATIVE  NEGATIVE mg/dL Final   Hgb urine dipstick 06/09/2024 NEGATIVE  NEGATIVE Final   Bilirubin Urine 06/09/2024 NEGATIVE  NEGATIVE Final   Ketones, ur 06/09/2024 NEGATIVE  NEGATIVE mg/dL Final   Protein, ur 92/96/7974 NEGATIVE  NEGATIVE mg/dL Final   Nitrite 92/96/7974 NEGATIVE  NEGATIVE Final   Leukocytes,Ua 06/09/2024 NEGATIVE  NEGATIVE Final   Performed at [facility] Lab, [address], [place], [place] [address]   Glucose-Capillary 06/08/2024 116 (H)  70 - 99 mg/dL Final   Glucose reference range applies only to samples taken after fasting for at least 8 hours.  Admission on 03/29/2024, Discharged on 03/29/2024  Component Date Value Ref Range Status   Sodium 03/29/2024 137  135 - 145 mmol/L Final   Potassium 03/29/2024 3.8  3.5 - 5.1 mmol/L Final   Chloride 03/29/2024 103  98 - 111 mmol/L Final   CO2 03/29/2024 23  22 - 32 mmol/L Final   Glucose, Bld 03/29/2024 125 (H)  70 - 99 mg/dL Final   Glucose reference range applies only to samples taken after fasting for at least 8 hours.   BUN 03/29/2024 14  6 - 20 mg/dL Final   Creatinine, Ser 03/29/2024 1.49 (H)  0.61 - 1.24 mg/dL Final   Calcium 95/77/7974 8.8 (L)  8.9 - 10.3 mg/dL Final   Total Protein 95/77/7974 6.7  6.5 - 8.1 g/dL Final   Albumin 95/77/7974 3.6  3.5 - 5.0 g/dL Final   AST 95/77/7974 60 (H)  15 - 41 U/L Final   ALT 03/29/2024 37  0 - 44 U/L Final   Alkaline Phosphatase 03/29/2024 55  38 - 126 U/L  Final   Total Bilirubin 03/29/2024 1.1  0.0 - 1.2 mg/dL Final   GFR, Estimated 03/29/2024 55 (L)  >60 mL/min Final   Comment: (NOTE) Calculated using the CKD-EPI Creatinine Equation (2021)    Anion gap 03/29/2024 11  5 - 15 Final   Performed at [facility], [address], [place], [place] [address]   WBC 03/29/2024 12.5 (H)  4.0 - 10.5 K/uL Final   RBC 03/29/2024 5.62  4.22 - 5.81 MIL/uL Final   Hemoglobin 03/29/2024 17.2 (H)  13.0 - 17.0 g/dL Final   HCT 95/77/7974 51.5  39.0 - 52.0 % Final   MCV 03/29/2024 91.6  80.0 - 100.0 fL Final   MCH 03/29/2024 30.6  26.0 - 34.0 pg Final   MCHC 03/29/2024 33.4  30.0 - 36.0 g/dL Final   RDW 95/77/7974 14.2  11.5 - 15.5 % Final   Platelets 03/29/2024 276  150 - 400 K/uL Final   nRBC 03/29/2024 0.0  0.0 - 0.2 % Final   Performed at [facility] Lab, [address] [place], [place] [address]   Alcohol, Ethyl (B) 03/29/2024 <10  <15 mg/dL Final   Comment: REPEATED TO VERIFY Please note change in reference range. (NOTE) For medical purposes only. Performed at [facility], [address] [place], [place] [address]    Color, Urine 03/29/2024 YELLOW  YELLOW Final   APPearance 03/29/2024 CLEAR  CLEAR Final   Specific Gravity, Urine 03/29/2024 1.038 (H)  1.005 - 1.030 Final   pH 03/29/2024 6.0  5.0 - 8.0 Final   Glucose, UA 03/29/2024 NEGATIVE  NEGATIVE mg/dL Final   Hgb urine dipstick 03/29/2024 NEGATIVE  NEGATIVE Final   Bilirubin Urine 03/29/2024 NEGATIVE  NEGATIVE Final   Ketones, ur 03/29/2024 5 (A)  NEGATIVE mg/dL Final   Protein, ur 95/77/7974 NEGATIVE  NEGATIVE mg/dL Final   Nitrite 95/77/7974 NEGATIVE  NEGATIVE Final   Leukocytes,Ua 03/29/2024 NEGATIVE  NEGATIVE Final   Performed at [facility] Lab, [address], [place], [place] [address]   Lactic Acid, Venous 03/29/2024 1.4  0.5 - 1.9 mmol/L Final   Prothrombin Time 03/29/2024 13.0  11.4 - 15.2 seconds Final   INR 03/29/2024 1.0  0.8 - 1.2 Final   Comment:  (NOTE) INR goal varies based on device and disease states. Performed at [facility], [address] [place], [place] [address]    Blood Bank Specimen 03/29/2024 SAMPLE AVAILABLE FOR TESTING   Final   Sample Expiration 03/29/2024    Final                   Value:04/01/2024,2359 Performed at [facility], [address], [place], [place] [address]    Sodium 03/29/2024 140  135 - 145 mmol/L Final   Potassium 03/29/2024 3.9  3.5 - 5.1 mmol/L Final   Chloride 03/29/2024 106  98 - 111 mmol/L Final   BUN 03/29/2024 20  6 - 20 mg/dL Final   Creatinine, Ser 03/29/2024 1.50 (H)  0.61 - 1.24 mg/dL Final   Glucose, Bld 95/77/7974 126 (H)  70 - 99 mg/dL Final   Glucose reference range applies only to samples taken after fasting for at least 8 hours.   Calcium, Ion 03/29/2024 1.06 (L)  1.15 - 1.40 mmol/L Final   TCO2 03/29/2024 25  22 - 32 mmol/L Final   Hemoglobin 03/29/2024 17.3 (H)  13.0 - 17.0 g/dL Final   HCT 95/77/7974 51.0  39.0 - 52.0 % Final    Allergies: Patient has no known allergies.  Medications:  Facility Ordered Medications  Medication   LORazepam (ATIVAN) tablet 1 mg   Or   LORazepam (ATIVAN) tablet 0-4 mg   [START ON 06/11/2024] LORazepam (ATIVAN) tablet 1 mg   Or   [START ON 06/11/2024] LORazepam (ATIVAN) tablet 0-4 mg   [START ON 06/10/2024] thiamine (VITAMIN B1) tablet 100 mg   acetaminophen  (TYLENOL ) tablet 650 mg   alum & mag hydroxide-simeth (MAALOX/MYLANTA) 200-200-20 MG/5ML suspension 30 mL   magnesium hydroxide (MILK OF MAGNESIA) suspension 30 mL   haloperidol (HALDOL) tablet 5 mg   And   diphenhydrAMINE (BENADRYL) capsule 50 mg   haloperidol lactate (HALDOL) injection 5 mg   And   diphenhydrAMINE (BENADRYL) injection 50 mg   And   LORazepam (ATIVAN) injection 2 mg   haloperidol lactate (HALDOL) injection 10 mg   And   diphenhydrAMINE (BENADRYL) injection 50 mg   And   LORazepam (ATIVAN) injection 2 mg   nicotine (NICODERM CQ - dosed in mg/24  hours) patch 14 mg   PTA Medications  Medication Sig   atorvastatin (LIPITOR) 40 MG tablet Take 40 mg by mouth at bedtime.   fenofibrate  micronized (LOFIBRA) 134 MG capsule Take 134 mg by mouth at bedtime.  metFORMIN (GLUCOPHAGE-XR) 750 MG 24 hr tablet Take 750 mg by mouth every morning.   terbinafine (LAMISIL) 1 % cream Apply 1 Application topically 2 (two) times daily as needed (Apply to affected area).   traZODone (DESYREL) 50 MG tablet Take 50 mg by mouth at bedtime as needed for sleep. (Patient not taking: Reported on 06/09/2024)    Long Term Goals: Improvement in symptoms so as ready for discharge  Short Term Goals: Patient will verbalize feelings in meetings with treatment team members., Patient will attend at least of 50% of the groups daily., Pt will complete the PHQ9 on admission, day 3 and discharge., Patient will participate in completing the [place] Suicide Severity Rating Scale, Patient will score a low risk of violence for 24 hours prior to discharge, and Patient will take medications as prescribed daily.  Medical Decision Making  53 y.o. male with a past psychiatric history of cocaine abuse, EtOH abuse, and cannabis abuse and PMH of  hypertriglyceridemia, chronic pain, essential hypertension, and diabetes type 2 not on insulin, who presented to ED with depression and SI requesting substance abuse treatment, in the context of abuse of alcohol, cocaine, and cannabis.  Patient notes depressed mood the past week and started to have SI but denies SI to me at time of evaluation today stating I feel better. I just came to detox and get into inpatient rehab. He declines starting an antidepressant  Plan:  substance induced mood disorder versus adjustment disdorder (rule out MDD) Patient declines Zoloft 50 mg qdaily stating he is feeling better today and not suicida  Alcohol use disorder severe -start on CIWA protocol + PRN Ativan, denies history of complicated withdrawals -will  consider addition of naltrexone  Cocaine use disorder severe  Inpatient rehab referral  Recommendations  Based on my evaluation the patient does not appear to have an emergency medical condition.  Nakesha Ebrahim, MD 06/09/24  3:08 PM

## 2024-06-09 NOTE — Group Note (Signed)
 Group Topic: Social Support  Group Date: 06/09/2024 Start Time: 1645 End Time: 1705 Facilitators: Celinda Suzen NOVAK, NT  Department: [facility]  Number of Participants: 9  Group Focus: recovery and self-esteem Treatment Modality:  Psychoeducation Interventions utilized were support Purpose: increase insight and regain self-worth  Name: Chase Higgins Date of Birth: 03/10/1971  MR: [id number]    Level of Participation: moderate Quality of Participation: offered feedback Interactions with others: gave feedback Mood/Affect: appropriate Triggers (if applicable): n/a Cognition: coherent/clear Progress: Other Response: PT stated to just say no to drugs and alcohol Plan: patient will be encouraged to attend group  Patients Problems:  Patient Active Problem List   Diagnosis Date Noted   Substance induced mood disorder (HCC) 06/09/2024   Cocaine use disorder, severe, dependence (HCC) 06/09/2024   Alcohol use disorder, severe, dependence (HCC) 06/09/2024   Cannabis use disorder, severe, dependence (HCC) 06/09/2024   Cough 11/10/2018   Smoker 11/10/2018   Lower respiratory infection 11/10/2018   Chest congestion 11/10/2018   Gout attack 04/10/2014   Calcium pyrophosphate arthropathy 02/01/2014   Diabetes mellitus, type 2 (HCC) 01/31/2014   Essential hypertension, benign 01/31/2014   Hypertriglyceridemia 01/31/2014

## 2024-06-09 NOTE — Discharge Instructions (Signed)
 Will be transported directly to behavioral health for further treatment and evaluation

## 2024-06-09 NOTE — ED Notes (Signed)
 Pt was wanded by security; Pocket knife in folder sealed up and left on chart for now-Monique,RN

## 2024-06-10 MED ORDER — LORAZEPAM 1 MG PO TABS
1.0000 mg | ORAL_TABLET | Freq: Two times a day (BID) | ORAL | Status: DC
  Filled 2024-06-10: qty 1

## 2024-06-10 MED ORDER — LORAZEPAM 1 MG PO TABS
1.0000 mg | ORAL_TABLET | Freq: Three times a day (TID) | ORAL | Status: AC
  Administered 2024-06-10: 1 mg via ORAL
  Filled 2024-06-10: qty 1

## 2024-06-10 NOTE — ED Notes (Signed)
 Patient is in the bedroom sleeping calm and composed. NAD. Respirations even and unlabored. Will monitor for safety.

## 2024-06-10 NOTE — ED Notes (Signed)
 Pt sitting in dayroom participating with group. No acute distress noted. No concerns voiced. Informed pt to notify staff with any needs or assistance. Pt verbalized understanding and agreement. Will continue to monitor for safety.

## 2024-06-10 NOTE — ED Provider Notes (Addendum)
 Behavioral Health Progress Note  Date and Time: 06/10/2024 12:31 PM Name: Chase Higgins MRN:  [id number]  Subjective:  Patient reports his mood is improved the last 2 days and he is no longer having SI or passive thoughts of death. States his depression this past week was substance induced and I'm feeling better. Reports good sleep and appetite. He does feels hopeless about his homelessness and would like inpatient rehab versus sober living placement. Working with social work.  Diagnosis:  Final diagnoses:  Cocaine dependence with withdrawal (HCC)  Uncomplicated alcohol dependence (HCC)  Substance induced mood disorder (HCC)    Total Time spent with patient: 30 minutes  Past Psychiatric History:  Denies prior medications for depression or seeing a psychiatrist, reports 1 week history of depression the past 2 week without any prior hospitalizations; long term cocaine and alcohol abuse Denies any prior suicide attempts  Past Medical History: type 2 DM, arthritis, gout, is not taking any medications Family History:The patient's family history includesDiabetes in his father; Hyperlipidemia in his mother; Hypertension in his mother; Stroke in his father. Denies mental health history or suicide Social History: unemployed homeless after being kicked out by family due to substance abuse  Additional Social History:                         Sleep: Fair  Appetite:  Fair  Current Medications:  Current Facility-Administered Medications  Medication Dose Route Frequency Provider Last Rate Last Admin   acetaminophen  (TYLENOL ) tablet 650 mg  650 mg Oral Q6H PRN Mannie Jerel PARAS, NP       alum & mag hydroxide-simeth (MAALOX/MYLANTA) 200-200-20 MG/5ML suspension 30 mL  30 mL Oral Q4H PRN Mannie Jerel PARAS, NP       atorvastatin  (LIPITOR) tablet 40 mg  40 mg Oral QHS Zareen Jamison, MD   40 mg at 06/09/24 2142   haloperidol  (HALDOL ) tablet 5 mg  5 mg Oral TID PRN Mannie Jerel PARAS, NP        And   diphenhydrAMINE  (BENADRYL ) capsule 50 mg  50 mg Oral TID PRN Mannie Jerel PARAS, NP       haloperidol  lactate (HALDOL ) injection 5 mg  5 mg Intramuscular TID PRN Mannie Jerel PARAS, NP       And   diphenhydrAMINE  (BENADRYL ) injection 50 mg  50 mg Intramuscular TID PRN Mannie Jerel PARAS, NP       And   LORazepam  (ATIVAN ) injection 2 mg  2 mg Intramuscular TID PRN Mannie Jerel PARAS, NP       haloperidol  lactate (HALDOL ) injection 10 mg  10 mg Intramuscular TID PRN Mannie Jerel PARAS, NP       And   diphenhydrAMINE  (BENADRYL ) injection 50 mg  50 mg Intramuscular TID PRN Mannie Jerel PARAS, NP       And   LORazepam  (ATIVAN ) injection 2 mg  2 mg Intramuscular TID PRN Mannie Jerel PARAS, NP       LORazepam  (ATIVAN ) tablet 1 mg  1 mg Oral Q6H Mannie Jerel PARAS, NP   1 mg at 06/10/24 0654   [START ON 06/11/2024] LORazepam  (ATIVAN ) tablet 1 mg  1 mg Oral Q12H Mannie Jerel PARAS, NP       LORazepam  (ATIVAN ) tablet 1 mg  1 mg Oral Q6H PRN Ruel Dimmick, MD       magnesium  hydroxide (MILK OF MAGNESIA) suspension 30 mL  30 mL Oral Daily PRN Mannie Jerel PARAS, NP  metFORMIN  (GLUCOPHAGE -XR) 24 hr tablet 500 mg  500 mg Oral BID WC Skyelyn Scruggs, MD   500 mg at 06/10/24 0830   multivitamin with minerals tablet 1 tablet  1 tablet Oral Daily Lelan Cush, MD   1 tablet at 06/10/24 1051   nicotine  (NICODERM CQ  - dosed in mg/24 hours) patch 14 mg  14 mg Transdermal Q0600 Mannie Jerel PARAS, NP   14 mg at 06/09/24 1108   thiamine  (VITAMIN B1) tablet 100 mg  100 mg Oral Daily Tavaras Goody, MD   100 mg at 06/10/24 1051   traZODone  (DESYREL ) tablet 50 mg  50 mg Oral QHS PRN Makana Feigel, MD   50 mg at 06/09/24 2142   Current Outpatient Medications  Medication Sig Dispense Refill   atorvastatin  (LIPITOR) 40 MG tablet Take 40 mg by mouth at bedtime.     fenofibrate  micronized (LOFIBRA) 134 MG capsule Take 134 mg by mouth at bedtime.     metFORMIN  (GLUCOPHAGE -XR) 750 MG 24 hr tablet Take 750 mg by mouth every morning.      Multiple Vitamin (MULTIVITAMIN WITH MINERALS) TABS tablet Take 1 tablet by mouth daily.     terbinafine (LAMISIL) 1 % cream Apply 1 Application topically 2 (two) times daily as needed (Apply to affected area).     traZODone  (DESYREL ) 50 MG tablet Take 50 mg by mouth at bedtime as needed for sleep. (Patient not taking: Reported on 06/09/2024)      Labs  Lab Results:  Admission on 06/08/2024, Discharged on 06/09/2024  Component Date Value Ref Range Status   WBC 06/08/2024 9.7  4.0 - 10.5 K/uL Final   RBC 06/08/2024 5.31  4.22 - 5.81 MIL/uL Final   Hemoglobin 06/08/2024 16.8  13.0 - 17.0 g/dL Final   HCT 92/97/7974 50.0  39.0 - 52.0 % Final   MCV 06/08/2024 94.2  80.0 - 100.0 fL Final   MCH 06/08/2024 31.6  26.0 - 34.0 pg Final   MCHC 06/08/2024 33.6  30.0 - 36.0 g/dL Final   RDW 92/97/7974 14.0  11.5 - 15.5 % Final   Platelets 06/08/2024 324  150 - 400 K/uL Final   nRBC 06/08/2024 0.0  0.0 - 0.2 % Final   Neutrophils Relative % 06/08/2024 61  % Final   Neutro Abs 06/08/2024 6.0  1.7 - 7.7 K/uL Final   Lymphocytes Relative 06/08/2024 20  % Final   Lymphs Abs 06/08/2024 1.9  0.7 - 4.0 K/uL Final   Monocytes Relative 06/08/2024 9  % Final   Monocytes Absolute 06/08/2024 0.9  0.1 - 1.0 K/uL Final   Eosinophils Relative 06/08/2024 8  % Final   Eosinophils Absolute 06/08/2024 0.7 (H)  0.0 - 0.5 K/uL Final   Basophils Relative 06/08/2024 1  % Final   Basophils Absolute 06/08/2024 0.1  0.0 - 0.1 K/uL Final   Immature Granulocytes 06/08/2024 1  % Final   Abs Immature Granulocytes 06/08/2024 0.08 (H)  0.00 - 0.07 K/uL Final   Performed at [facility] Lab, [address] [place], [place] [address]   Salicylate Lvl 06/08/2024 <7.0 (L)  7.0 - 30.0 mg/dL Final   Performed at [facility], [address], [place], [place] [address]   Acetaminophen  (Tylenol ), Serum 06/08/2024 <10 (L)  10 - 30 ug/mL Final   Comment: (NOTE) Therapeutic concentrations vary significantly. A range of 10-30 ug/mL   may be an effective concentration for many patients. However, some  are best treated at concentrations outside of  this range. Acetaminophen  concentrations >150 ug/mL at 4 hours after ingestion  and >50 ug/mL at 12 hours after ingestion are often associated with  toxic reactions.  Performed at [facility] Lab, [address] [place], [place] [address]    Sodium 06/08/2024 139  135 - 145 mmol/L Final   Potassium 06/08/2024 3.6  3.5 - 5.1 mmol/L Final   Chloride 06/08/2024 104  98 - 111 mmol/L Final   CO2 06/08/2024 23  22 - 32 mmol/L Final   Glucose, Bld 06/08/2024 111 (H)  70 - 99 mg/dL Final   Glucose reference range applies only to samples taken after fasting for at least 8 hours.   BUN 06/08/2024 11  6 - 20 mg/dL Final   Creatinine, Ser 06/08/2024 1.11  0.61 - 1.24 mg/dL Final   Calcium  06/08/2024 8.9  8.9 - 10.3 mg/dL Final   Total Protein 92/97/7974 6.7  6.5 - 8.1 g/dL Final   Albumin 92/97/7974 3.4 (L)  3.5 - 5.0 g/dL Final   AST 92/97/7974 14 (L)  15 - 41 U/L Final   ALT 06/08/2024 12  0 - 44 U/L Final   Alkaline Phosphatase 06/08/2024 61  38 - 126 U/L Final   Total Bilirubin 06/08/2024 0.6  0.0 - 1.2 mg/dL Final   GFR, Estimated 06/08/2024 >60  >60 mL/min Final   Comment: (NOTE) Calculated using the CKD-EPI Creatinine Equation (2021)    Anion gap 06/08/2024 12  5 - 15 Final   Performed at [facility] Lab, [address], [place], [place] [address]   Alcohol, Ethyl (B) 06/08/2024 <15  <15 mg/dL Final   Comment: (NOTE) For medical purposes only. Performed at [facility], [address], [place], [place] [address]    Opiates 06/09/2024 NONE DETECTED  NONE DETECTED Final   Cocaine 06/09/2024 POSITIVE (A)  NONE DETECTED Final   Benzodiazepines 06/09/2024 POSITIVE (A)  NONE DETECTED Final   Amphetamines 06/09/2024 NONE DETECTED  NONE DETECTED Final   Tetrahydrocannabinol 06/09/2024 NONE DETECTED  NONE DETECTED Final   Barbiturates 06/09/2024 NONE DETECTED   NONE DETECTED Final   Comment: (NOTE) DRUG SCREEN FOR MEDICAL PURPOSES ONLY.  IF CONFIRMATION IS NEEDED FOR ANY PURPOSE, NOTIFY LAB WITHIN 5 DAYS.  LOWEST DETECTABLE LIMITS FOR URINE DRUG SCREEN Drug Class                     Cutoff (ng/mL) Amphetamine and metabolites    1000 Barbiturate and metabolites    200 Benzodiazepine                 200 Opiates and metabolites        300 Cocaine and metabolites        300 THC                            50 Performed at [facility] Lab, [address], [place], [place] [address]    Color, Urine 06/09/2024 YELLOW  YELLOW Final   APPearance 06/09/2024 CLEAR  CLEAR Final   Specific Gravity, Urine 06/09/2024 1.015  1.005 - 1.030 Final   pH 06/09/2024 5.0  5.0 - 8.0 Final   Glucose, UA 06/09/2024 NEGATIVE  NEGATIVE mg/dL Final   Hgb urine dipstick 06/09/2024 NEGATIVE  NEGATIVE Final   Bilirubin Urine 06/09/2024 NEGATIVE  NEGATIVE Final   Ketones, ur 06/09/2024 NEGATIVE  NEGATIVE mg/dL Final   Protein, ur 92/96/7974 NEGATIVE  NEGATIVE mg/dL Final   Nitrite 92/96/7974 NEGATIVE  NEGATIVE Final   Leukocytes,Ua 06/09/2024 NEGATIVE  NEGATIVE Final   Performed at [facility] Lab, [address], [place], [place] [address]   Glucose-Capillary 06/08/2024 116 (H)  70 - 99 mg/dL Final   Glucose reference range applies only to samples taken after fasting for at least 8 hours.  Admission on 03/29/2024, Discharged on 03/29/2024  Component Date Value Ref Range Status   Sodium 03/29/2024 137  135 - 145 mmol/L Final   Potassium 03/29/2024 3.8  3.5 - 5.1 mmol/L Final   Chloride 03/29/2024 103  98 - 111 mmol/L Final   CO2 03/29/2024 23  22 - 32 mmol/L Final   Glucose, Bld 03/29/2024 125 (H)  70 - 99 mg/dL Final   Glucose reference range applies only to samples taken after fasting for at least 8 hours.   BUN 03/29/2024 14  6 - 20 mg/dL Final   Creatinine, Ser 03/29/2024 1.49 (H)  0.61 - 1.24 mg/dL Final   Calcium  03/29/2024 8.8 (L)  8.9 - 10.3 mg/dL  Final   Total Protein 03/29/2024 6.7  6.5 - 8.1 g/dL Final   Albumin 95/77/7974 3.6  3.5 - 5.0 g/dL Final   AST 95/77/7974 60 (H)  15 - 41 U/L Final   ALT 03/29/2024 37  0 - 44 U/L Final   Alkaline Phosphatase 03/29/2024 55  38 - 126 U/L Final   Total Bilirubin 03/29/2024 1.1  0.0 - 1.2 mg/dL Final   GFR, Estimated 03/29/2024 55 (L)  >60 mL/min Final   Comment: (NOTE) Calculated using the CKD-EPI Creatinine Equation (2021)    Anion gap 03/29/2024 11  5 - 15 Final   Performed at [facility] Lab, [address], [place], [place] [address]   WBC 03/29/2024 12.5 (H)  4.0 - 10.5 K/uL Final   RBC 03/29/2024 5.62  4.22 - 5.81 MIL/uL Final   Hemoglobin 03/29/2024 17.2 (H)  13.0 - 17.0 g/dL Final   HCT 95/77/7974 51.5  39.0 - 52.0 % Final   MCV 03/29/2024 91.6  80.0 - 100.0 fL Final   MCH 03/29/2024 30.6  26.0 - 34.0 pg Final   MCHC 03/29/2024 33.4  30.0 - 36.0 g/dL Final   RDW 95/77/7974 14.2  11.5 - 15.5 % Final   Platelets 03/29/2024 276  150 - 400 K/uL Final   nRBC 03/29/2024 0.0  0.0 - 0.2 % Final   Performed at [facility] Lab, [address], [place], [place] [address]   Alcohol, Ethyl (B) 03/29/2024 <10  <15 mg/dL Final   Comment: REPEATED TO VERIFY Please note change in reference range. (NOTE) For medical purposes only. Performed at [facility], [address], [place], [place] [address]    Color, Urine 03/29/2024 YELLOW  YELLOW Final   APPearance 03/29/2024 CLEAR  CLEAR Final   Specific Gravity, Urine 03/29/2024 1.038 (H)  1.005 - 1.030 Final   pH 03/29/2024 6.0  5.0 - 8.0 Final   Glucose, UA 03/29/2024 NEGATIVE  NEGATIVE mg/dL Final   Hgb urine dipstick 03/29/2024 NEGATIVE  NEGATIVE Final   Bilirubin Urine 03/29/2024 NEGATIVE  NEGATIVE Final   Ketones, ur 03/29/2024 5 (A)  NEGATIVE mg/dL Final   Protein, ur 95/77/7974 NEGATIVE  NEGATIVE mg/dL Final   Nitrite 95/77/7974 NEGATIVE  NEGATIVE Final   Leukocytes,Ua 03/29/2024 NEGATIVE  NEGATIVE Final   Performed at  [facility] Lab, [address], [place], [place] [address]  Lactic Acid, Venous 03/29/2024 1.4  0.5 - 1.9 mmol/L Final   Prothrombin Time 03/29/2024 13.0  11.4 - 15.2 seconds Final   INR 03/29/2024 1.0  0.8 - 1.2 Final   Comment: (NOTE) INR goal varies based on device and disease states. Performed at [facility] Lab, [address] [place], [place] [address]    Blood Bank Specimen 03/29/2024 SAMPLE AVAILABLE FOR TESTING   Final   Sample Expiration 03/29/2024    Final                   Value:04/01/2024,2359 Performed at [facility] Lab, [address], [place], [place] [address]    Sodium 03/29/2024 140  135 - 145 mmol/L Final   Potassium 03/29/2024 3.9  3.5 - 5.1 mmol/L Final   Chloride 03/29/2024 106  98 - 111 mmol/L Final   BUN 03/29/2024 20  6 - 20 mg/dL Final   Creatinine, Ser 03/29/2024 1.50 (H)  0.61 - 1.24 mg/dL Final   Glucose, Bld 95/77/7974 126 (H)  70 - 99 mg/dL Final   Glucose reference range applies only to samples taken after fasting for at least 8 hours.   Calcium , Ion 03/29/2024 1.06 (L)  1.15 - 1.40 mmol/L Final   TCO2 03/29/2024 25  22 - 32 mmol/L Final   Hemoglobin 03/29/2024 17.3 (H)  13.0 - 17.0 g/dL Final   HCT 95/77/7974 51.0  39.0 - 52.0 % Final    Blood Alcohol level:  Lab Results  Component Value Date   [facility] <15 06/08/2024   ETH <10 03/29/2024    Metabolic Disorder Labs: Lab Results  Component Value Date   HGBA1C 6.2 (H) 01/15/2018   MPG 131 (H) 01/02/2015   No results found for: PROLACTIN Lab Results  Component Value Date   CHOL 242 (H) 01/15/2018   TRIG 1,004 (HH) 01/15/2018   HDL 28 (L) 01/15/2018   CHOLHDL 8.6 (H) 01/15/2018   VLDL 48 (H) 01/02/2015   LDLCALC Comment 01/15/2018   LDLCALC Comment 04/02/2017    Therapeutic Lab Levels: No results found for: LITHIUM No results found for: VALPROATE No results found for: CBMZ  Physical Findings   PHQ2-9    Flowsheet Row Telemedicine from 11/13/2020 in Primary Care  at [facility] Visit from 11/10/2018 in Primary Care at [facility] Visit from 01/28/2018 in Primary Care at [facility] Visit from 01/15/2018 in Primary Care at [facility] Visit from 04/02/2017 in Primary Care at [facility]  PHQ-2 Total Score 0 0 0 0 0   Flowsheet Row ED from 06/09/2024 in [facility] ED from 06/08/2024 in [facility] Emergency Department at [facility] UC from 04/06/2024 in [facility] at [facility])  C-SSRS RISK CATEGORY High Risk High Risk No Risk     Musculoskeletal  Strength & Muscle Tone: within normal limits Gait & Station: normal Patient leans: N/A  Psychiatric Specialty Exam  Presentation  General Appearance:  Disheveled  Eye Contact: Fair  Speech: Clear and Coherent  Speech Volume: Decreased  Handedness:No data recorded  Mood and Affect  Mood: Anxious  Affect: Appropriate   Thought Process  Thought Processes: Coherent  Descriptions of Associations:Intact  Orientation:Full (Time, Place and Person)  Thought Content:Logical     Hallucinations:Hallucinations: None  Ideas of Reference:None  Suicidal Thoughts:Suicidal Thoughts: No  Homicidal Thoughts:Homicidal Thoughts: No   Sensorium  Memory: Immediate Fair  Judgment: Fair  Insight: Fair   Executive Functions  Concentration:  Fair  Attention Span: Fair  Recall: [employer] of Knowledge: Fair  Language: Fair   Psychomotor Activity  Psychomotor Activity: Psychomotor Activity: Normal   Assets  Assets: Manufacturing systems engineer; Desire for Improvement; Physical Health   Sleep  Sleep: Sleep: Fair  Estimated Sleeping Duration (Last 24 Hours): 11.25-13.75 hours  No data recorded  Physical Exam  Physical exam:  General: Well developed, well nourished, Asian male.  Pupils: Normal at 3mm Respiratory: Breathing is unlabored.  Cardiovascular: No edema.  Language: No anomia, no aphasia Muscle  strength and tone-pt moving all extremities.  Gait not assessed as pt remained in bed.  Neuro: Facial muscles are symmetric. Pt without tremor, no evidence of hyperarousal.    Review of Systems  Constitutional: Negative.   HENT: Negative.    Eyes: Negative.   Respiratory: Negative.    Cardiovascular: Negative.   Gastrointestinal: Negative.   Genitourinary: Negative.   Musculoskeletal: Negative.   Skin: Negative.   Neurological: Negative.   Endo/Heme/Allergies: Negative.   Psychiatric/Behavioral:  negative for depression, substance abuse and suicidal ideas. The patient denies nervous/anxious and no longer has insomnia.  Blood pressure 124/84, pulse 82, temperature 97.8 F (36.6 C), temperature source Oral, resp. rate 16, SpO2 100%. There is no height or weight on file to calculate BMI.  Treatment Plan Summary: 53 y.o. male with a past psychiatric history of cocaine abuse, EtOH abuse, and cannabis abuse and PMH of  hypertriglyceridemia, chronic pain, essential hypertension, and diabetes type 2 not on insulin, who presented to ED with depression and SI requesting substance abuse treatment, in the context of abuse of alcohol, cocaine, and cannabis.   7/3: Patient notes depressed mood the past week and started to have SI but denies SI to me at time of evaluation today stating I feel better. I just came to detox and get into inpatient rehab. He declines starting an antidepressant  7/4: patient notes improved mood. Completing detox. Denies SI or HI. Declines any medications for depression or anxiety. Working with social work for sober living versus inpatient rehab referral   Plan:  substance induced mood disorder versus adjustment disdorder (rule out MDD) Patient declines Zoloft 50 mg qdaily stating he is feeling better today and not suicida   Alcohol use disorder severe -cont Ativan  taper (decreasing to 1 mg q8h today and than q12 tomorrow) + PRN Ativan   -will consider addition of  naltrexone, however patient is declining at this time   Cocaine use disorder severe  Inpatient rehab referral   Recommendations  Based on my evaluation the patient does not appear to have an emergency medical condition.    Shemica Meath, MD 06/10/2024 12:31 PM

## 2024-06-10 NOTE — ED Notes (Signed)
 Patient A&Ox4. Denies intent to harm self/others when asked. Denies A/VH. Patient denies any physical complaints when asked. No acute distress noted. Support and encouragement provided. Routine safety checks conducted according to facility protocol. Encouraged patient to notify staff if thoughts of harm toward self or others arise. Patient verbalize understanding and agreement. Will continue to monitor for safety.

## 2024-06-10 NOTE — Group Note (Signed)
 Group Topic: Relaxation  Group Date: 06/10/2024 Start Time: 1040 End Time: 1110 Facilitators: Alyse Leilani LABOR, NT  Department: [facility]  Number of Participants: 9  Group Focus: activities of daily living skills and relaxation Treatment Modality:  Individual Therapy Interventions utilized were group exercise and story telling Purpose: enhance coping skills  Name: Josias Tomerlin Date of Birth: 06-25-1971  MR: [id number]   Pt didn't attend group Patients Problems:  Patient Active Problem List   Diagnosis Date Noted   Substance induced mood disorder (HCC) 06/09/2024   Cocaine use disorder, severe, dependence (HCC) 06/09/2024   Alcohol use disorder, severe, dependence (HCC) 06/09/2024   Cannabis use disorder, severe, dependence (HCC) 06/09/2024   Cough 11/10/2018   Smoker 11/10/2018   Lower respiratory infection 11/10/2018   Chest congestion 11/10/2018   Gout attack 04/10/2014   Calcium  pyrophosphate arthropathy 02/01/2014   Diabetes mellitus, type 2 (HCC) 01/31/2014   Essential hypertension, benign 01/31/2014   Hypertriglyceridemia 01/31/2014

## 2024-06-10 NOTE — ED Notes (Signed)
 Pt sleeping in no acute distress. RR even and unlabored. Environment secured. Will continue to monitor for safety.

## 2024-06-10 NOTE — Group Note (Signed)
 Group Topic: Communication  Group Date: 06/10/2024 Start Time: 1710 End Time: 1750 Facilitators: Daved Tinnie HERO, RN  Department: [facility]  Number of Participants: 9  Group Focus: communication Treatment Modality:  Psychoeducation Interventions utilized were patient education Purpose: improve communication skills  Name: Myung-Suk Ihde Date of Birth: [birth year]  MR: [id number]    Level of Participation: active Quality of Participation: attentive and cooperative Interactions with others: gave feedback Mood/Affect: appropriate Triggers (if applicable): n/a Cognition: coherent/clear Progress: Significant Response: pt says he will help regulate his emotions by thinking before he reacts Plan: patient will be encouraged to attend future RN groups  Patients Problems:  Patient Active Problem List   Diagnosis Date Noted   Substance induced mood disorder (HCC) 06/09/2024   Cocaine use disorder, severe, dependence (HCC) 06/09/2024   Alcohol use disorder, severe, dependence (HCC) 06/09/2024   Cannabis use disorder, severe, dependence (HCC) 06/09/2024   Cough 11/10/2018   Smoker 11/10/2018   Lower respiratory infection 11/10/2018   Chest congestion 11/10/2018   Gout attack 04/10/2014   Calcium  pyrophosphate arthropathy 02/01/2014   Diabetes mellitus, type 2 (HCC) 01/31/2014   Essential hypertension, benign 01/31/2014   Hypertriglyceridemia 01/31/2014

## 2024-06-10 NOTE — Discharge Planning (Signed)
 SW received voice mail from [place] [facility] that patient was approved for residential treatment program. SW will follow up with patient regarding acceptance and return call.

## 2024-06-11 DIAGNOSIS — F1423 Cocaine dependence with withdrawal: Secondary | ICD-10-CM | POA: Diagnosis not present

## 2024-06-11 DIAGNOSIS — F1020 Alcohol dependence, uncomplicated: Secondary | ICD-10-CM | POA: Diagnosis not present

## 2024-06-11 DIAGNOSIS — F1994 Other psychoactive substance use, unspecified with psychoactive substance-induced mood disorder: Secondary | ICD-10-CM

## 2024-06-11 MED ORDER — VITAMIN B-1 100 MG PO TABS: 100.0000 mg | ORAL_TABLET | Freq: Every day | ORAL | Status: AC

## 2024-06-11 MED ORDER — ATORVASTATIN CALCIUM 40 MG PO TABS: 40.0000 mg | ORAL_TABLET | Freq: Every day | ORAL | 0 refills | Status: AC

## 2024-06-11 MED ORDER — METFORMIN HCL ER 500 MG PO TB24: 500.0000 mg | ORAL_TABLET | Freq: Two times a day (BID) | ORAL | 0 refills | Status: AC

## 2024-06-11 MED ORDER — NICOTINE 14 MG/24HR TD PT24: 14.0000 mg | MEDICATED_PATCH | Freq: Every day | TRANSDERMAL | 0 refills | Status: AC

## 2024-06-11 MED ORDER — TRAZODONE HCL 50 MG PO TABS: 50.0000 mg | ORAL_TABLET | Freq: Every evening | ORAL | 0 refills | Status: AC | PRN

## 2024-06-11 NOTE — ED Notes (Addendum)
 Patient leaving against medical advice, provider Dmitri Zouev, MD spoke with patient face to face as well as Clinical research associate. Patient states he is no longer interested in any residential services despite SW note stating they have been accepted for residential treatment. Patient voices understanding of AMA process and consequences. After Visit Summary (AVS) printed and given to patient, as well as printed prescriptions and a bus pass. AVS reviewed with patient and all questions fully answered. Patient left facility in no acute distress, A& O x4 and ambulatory. Patient denied SI/HI, A/VH upon leaving. Patient verbalized understanding of all discharge instructions explained by staff, including RX's and safety plan. Patient mood fair. Patient belongings returned to patient from locker #11 complete and intact. Patient escorted to lobby via staff for transport to destination. Safety maintained.

## 2024-06-11 NOTE — ED Notes (Signed)
 Pt is minimal to his bedroom. Pt denies SI/HI/AVH. Pt denies physical complaints. Pt is med compliant and safe on the unit at this time with Q 15 min safety checks in place.

## 2024-06-11 NOTE — ED Notes (Signed)
 Patient resting quietly in bed with eyes closed with unlabored breathing. Q 15 minute safety checks remain in place.  Pt remains safe on the unit at this time.

## 2024-06-11 NOTE — ED Provider Notes (Signed)
 FBC/OBS ASAP Discharge Summary  Date and Time: 06/11/2024 9:59 AM  Name: Chase Higgins  MRN:  [id number]   Discharge Diagnoses:  Final diagnoses:  Cocaine dependence with withdrawal (HCC)  Uncomplicated alcohol dependence (HCC)  Substance induced mood disorder (HCC)    Subjective: Patient filed 72 hours request for discharge yesterday afternoon. This morning he continues to request discharge. Patient denies SI, HI or AVH. Denies depression or anxiety. He is not interested in naltrexone or finishing Ativan  taper. I told patient he may relapse and he was dismissive with me and nurse, not denying that he may want to get high. I told him social work spent a lot of time setting up inpatient rehab referral and patient was dismissive stating he is not interested in going. Patient has been euthymic for >48 hours and is not having any acute symptoms of withdrawal. I discussed this is essentially and AM discharge, however patient does not meet IVC criteria.  Stay Summary:   Patient was admitted to inpatient psychiatry at [facility] for safety and stabilization. Patient was provided safe and therapeutic milieu, psychiatric and medical assessment, care and treatment, as well as support from nursing, behavioral health staff. Both psychotherapy and psychoeducation groups were provided. Different coping skills such as journaling, CBT and art therapy groups were offered. Additional consultation was provided by hospitalist for H&P and medical needs.  Patient was started on Ativan  taper + CIWA protocol. HE declined any antidepressants or naltrexone, acamprosate, disulfiram. Patient's CIWA scores were less than 5. Patient presented euthymic and denied SI or HI for >48 hours prior to discharge. Patient tolerated without side effects and medications were titrated to therapeutic effect. As patient stabilized on medications and participated in therapeutic interventions, symptoms began to improve. Patient did not  finish his Ativan  taper and asked to leave AMA. I instructed him that I recommend he finish Ativan  taper and that social work placed referral for inpatient rehab at [facility]. Discussed potential risk for withdrawal but patient states I'm fine. I'm strong. Mood is euthymic and pleasant. Patient states he is not really interested in sobriety and is asking to leave AMA. Patient does not meet criteria for IVC. Unfortunately, patient's behaviors throughout admission have demonstrated he is not actually interested in sobriety at this time. Patient was also offered scripts but was dismissive and states I have to meet some friends, I have a friend picking me up and I have things to do.   On the day of discharge, the chart was reviewed, case was discussed with staff and the patient was seen in person. Patient's overall mood has improved. Patient was calm and cooperative and did not appear anxious. Patient reported adequate sleep and stable mood. Reports feeling good and denies SI, HI or AVH. Denies wanting to be dead. Patient was tolerating medications well without side effects reported or noted. Patient denied suicidal ideation, plan or intent, denied hopelessness, helplessness or worthlessness, and denied homicidal ideation. Insight and judgement have improved. Patient demonstrated future orientation and was motivated to follow-up with aftercare. Patient was encouraged to be adherent with medications. Patient was instructed to call 911, ask for help to go to the closest emergency room or crisis center, call crisis hotlines for help if in critical status or when symptoms were worsening. Patient voiced understanding of this information. At the time of discharge, patient had reached maximum benefit from hospitalization, was no longer considered to be dangerous to self or others, and was psychiatrically stable and otherwise appropriate for  discharge to less restrictive care in the community.     Total Time spent with  patient: 30 minutes  Past Psychiatric History:  Diagnoses: Alcohol abuse, cocaine abuse and substance induced mood disorder Denies prior medications for depression or seeing a psychiatrist Denies any prior suicide attempts  Past Medical History: type 2 DM, arthritis, gout, is not taking any medications Family History:The patient's family history includesDiabetes in his father; Hyperlipidemia in his mother; Hypertension in his mother; Stroke in his father. Denies mental health history or suicide Social History: unemployed homeless after being kicked out by family due to substance abuse Tobacco Cessation:  A prescription for an FDA-approved tobacco cessation medication was offered at discharge and the patient refused  Current Medications:  Current Facility-Administered Medications  Medication Dose Route Frequency Provider Last Rate Last Admin   acetaminophen  (TYLENOL ) tablet 650 mg  650 mg Oral Q6H PRN Mannie Jerel PARAS, NP       alum & mag hydroxide-simeth (MAALOX/MYLANTA) 200-200-20 MG/5ML suspension 30 mL  30 mL Oral Q4H PRN Mannie Jerel PARAS, NP       atorvastatin  (LIPITOR) tablet 40 mg  40 mg Oral QHS Legacie Dillingham, MD   40 mg at 06/10/24 2132   haloperidol  (HALDOL ) tablet 5 mg  5 mg Oral TID PRN Mannie Jerel PARAS, NP       And   diphenhydrAMINE  (BENADRYL ) capsule 50 mg  50 mg Oral TID PRN Mannie Jerel PARAS, NP       haloperidol  lactate (HALDOL ) injection 5 mg  5 mg Intramuscular TID PRN Mannie Jerel PARAS, NP       And   diphenhydrAMINE  (BENADRYL ) injection 50 mg  50 mg Intramuscular TID PRN Mannie Jerel PARAS, NP       And   LORazepam  (ATIVAN ) injection 2 mg  2 mg Intramuscular TID PRN Mannie Jerel PARAS, NP       haloperidol  lactate (HALDOL ) injection 10 mg  10 mg Intramuscular TID PRN Mannie Jerel PARAS, NP       And   diphenhydrAMINE  (BENADRYL ) injection 50 mg  50 mg Intramuscular TID PRN Mannie Jerel PARAS, NP       And   LORazepam  (ATIVAN ) injection 2 mg  2 mg Intramuscular TID PRN Mannie Jerel PARAS, NP       LORazepam  (ATIVAN ) tablet 1 mg  1 mg Oral Q12H Kunaal Walkins, MD       metFORMIN  (GLUCOPHAGE -XR) 24 hr tablet 500 mg  500 mg Oral BID WC Eric Nees, MD   500 mg at 06/11/24 0842   multivitamin with minerals tablet 1 tablet  1 tablet Oral Daily Terrea Bruster, MD   1 tablet at 06/11/24 9047   nicotine  (NICODERM CQ  - dosed in mg/24 hours) patch 14 mg  14 mg Transdermal Q0600 Mannie Jerel PARAS, NP   14 mg at 06/11/24 9476   thiamine  (VITAMIN B1) tablet 100 mg  100 mg Oral Daily Burel Kahre, MD   100 mg at 06/11/24 9047   traZODone  (DESYREL ) tablet 50 mg  50 mg Oral QHS PRN Zanyiah Posten, MD   50 mg at 06/09/24 2142   Current Outpatient Medications  Medication Sig Dispense Refill   atorvastatin  (LIPITOR) 40 MG tablet Take 40 mg by mouth at bedtime.     fenofibrate  micronized (LOFIBRA) 134 MG capsule Take 134 mg by mouth at bedtime.     metFORMIN  (GLUCOPHAGE -XR) 750 MG 24 hr tablet Take 750 mg by mouth every morning.  Multiple Vitamin (MULTIVITAMIN WITH MINERALS) TABS tablet Take 1 tablet by mouth daily.     terbinafine (LAMISIL) 1 % cream Apply 1 Application topically 2 (two) times daily as needed (Apply to affected area).     traZODone  (DESYREL ) 50 MG tablet Take 50 mg by mouth at bedtime as needed for sleep. (Patient not taking: Reported on 06/09/2024)      PTA Medications:  Facility Ordered Medications  Medication   [COMPLETED] LORazepam  (ATIVAN ) tablet 1 mg   acetaminophen  (TYLENOL ) tablet 650 mg   alum & mag hydroxide-simeth (MAALOX/MYLANTA) 200-200-20 MG/5ML suspension 30 mL   haloperidol  (HALDOL ) tablet 5 mg   And   diphenhydrAMINE  (BENADRYL ) capsule 50 mg   haloperidol  lactate (HALDOL ) injection 5 mg   And   diphenhydrAMINE  (BENADRYL ) injection 50 mg   And   LORazepam  (ATIVAN ) injection 2 mg   haloperidol  lactate (HALDOL ) injection 10 mg   And   diphenhydrAMINE  (BENADRYL ) injection 50 mg   And   LORazepam  (ATIVAN ) injection 2 mg   nicotine  (NICODERM CQ  -  dosed in mg/24 hours) patch 14 mg   atorvastatin  (LIPITOR) tablet 40 mg   traZODone  (DESYREL ) tablet 50 mg   metFORMIN  (GLUCOPHAGE -XR) 24 hr tablet 500 mg   [COMPLETED] thiamine  (VITAMIN B1) injection 100 mg   thiamine  (VITAMIN B1) tablet 100 mg   multivitamin with minerals tablet 1 tablet   [EXPIRED] LORazepam  (ATIVAN ) tablet 1 mg   Followed by   LORazepam  (ATIVAN ) tablet 1 mg   PTA Medications  Medication Sig   atorvastatin  (LIPITOR) 40 MG tablet Take 40 mg by mouth at bedtime.   fenofibrate  micronized (LOFIBRA) 134 MG capsule Take 134 mg by mouth at bedtime.   metFORMIN  (GLUCOPHAGE -XR) 750 MG 24 hr tablet Take 750 mg by mouth every morning.   terbinafine (LAMISIL) 1 % cream Apply 1 Application topically 2 (two) times daily as needed (Apply to affected area).   traZODone  (DESYREL ) 50 MG tablet Take 50 mg by mouth at bedtime as needed for sleep. (Patient not taking: Reported on 06/09/2024)       06/11/2024    9:59 AM 11/13/2020    1:23 PM 11/10/2018    9:38 AM  Depression screen PHQ 2/9  Decreased Interest 0 0 0  Down, Depressed, Hopeless 0 0 0  PHQ - 2 Score 0 0 0    Flowsheet Row ED from 06/09/2024 in [facility] ED from 06/08/2024 in [facility] UC from 04/06/2024 in [facility])  C-SSRS RISK CATEGORY High Risk High Risk No Risk    Musculoskeletal  Strength & Muscle Tone: within normal limits Gait & Station: normal Patient leans: N/A  Psychiatric Specialty Exam  Presentation  General Appearance:  Appropriate for Environment  Eye Contact: Fair  Speech: Clear and Coherent  Speech Volume: Normal  Handedness:No data recorded  Mood and Affect  Mood: Euthymic  Affect: Appropriate; Congruent   Thought Process  Thought Processes: Coherent  Descriptions of Associations:Intact  Orientation:Full (Time, Place and Person)  Thought Content:Logical      Hallucinations:Hallucinations: None  Ideas of Reference:None  Suicidal Thoughts:Suicidal Thoughts: No  Homicidal Thoughts:Homicidal Thoughts: No   Sensorium  Memory: Immediate Fair  Judgment: Fair  Insight: Fair   Art therapist  Concentration: Fair  Attention Span: Fair  Recall: Fair  Fund of Knowledge: Fair  Language: Fair   Psychomotor Activity  Psychomotor Activity: Psychomotor Activity: Normal  Assets  Assets: Desire for Improvement; Resilience   Sleep  Sleep: Sleep: Fair  Estimated Sleeping Duration (Last 24 Hours): 13.25-14.00 hours  No data recorded  Physical Exam  Physical exam:  General: Well developed, well nourished, Asian male.  Pupils: Normal at 3mm Respiratory: Breathing is unlabored.  Cardiovascular: No edema.  Language: No anomia, no aphasia Muscle strength and tone-pt moving all extremities.  Gait not assessed as pt remained in bed.  Neuro: Facial muscles are symmetric. Pt without tremor, no evidence of hyperarousal.      Review of Systems  Constitutional: Negative.   HENT: Negative.    Eyes: Negative.   Respiratory: Negative.    Cardiovascular: Negative.   Gastrointestinal: Negative.   Genitourinary: Negative.   Musculoskeletal: Negative.   Skin: Negative.   Neurological: Negative.   Endo/Heme/Allergies: Negative.   Psychiatric/Behavioral:  negative for depression,  negative for substance abuse and negative for suicidal ideas. The patient denies nervous/anxious and no longer has insomnia.  Blood pressure 130/88, pulse 84, temperature 98.6 F (37 C), temperature source Oral, resp. rate 18, SpO2 95%. There is no height or weight on file to calculate BMI.  Demographic Factors:  Male, Low socioeconomic status, Living alone, and Unemployed  Loss Factors: Decrease in vocational status, Loss of significant relationship, and Financial problems/change in socioeconomic status  Historical  Factors: Impulsivity  Risk Reduction Factors:   Positive social support and Positive coping skills or problem solving skills  Continued Clinical Symptoms:  Alcohol/Substance Abuse/Dependencies  Cognitive Features That Contribute To Risk:  Closed-mindedness    Suicide Risk:  Minimal: No identifiable suicidal ideation.  Patients presenting with no risk factors but with morbid ruminations; may be classified as minimal risk based on the severity of the depressive symptoms  Patient denies SI or HI for >48 hours. Denies wanting to be dead and future oriented. SRA complete and acute risk for suicide is low.   [place] Suicide Risk assessment:  1. Do you wish to be dead? NONE REPORTED 2. Have you wished your dead or wished you could go to sleep and not wake up? NONE REPORTED 3.  Have you actually had thoughts of killing yourself?  NONE REPORTED 4.  Have you been thinking about how you might do this?  NONE REPORTED 5.  Have you had these thoughts and some intention of acting on them? NONE REPORTED 6.  Have you started to work out or worked out the details to kill yourself? NONE REPORTED 7.  Do you intend to carry out this plan? NONE REPORTED 8. On a scale of 1-5 with 1 being the least severe and 5 being the most severe answer the following questions place for intensity of ideation. ZERO 9. How many times have you had these thoughts? NONE REPORTED 10. When you have the thoughts how long to the last?  NONE REPORTED 11. Control ability.  Could you or can you stop thinking about killing herself or wanting to die if you want to?  YES 12. Are there any things anyone or anything family religion pain of death that stop you from wanting to die or acting on thoughts of committing suicide?  FAMILY 13.  What sort of reason to do have to think about wanting to die or killing yourself? NONE REPORTED 14.Was it to end the pain or stop the way you are feeling in other words you could not go on living with his  pain or how you are feeling or was not to get attention revenge  or reaction from others?  Or both?  NONE REPORTED  [place] Suicide Risk assessment:  1. Do you wish to be dead? NONE REPORTED 2. Have you wished your dead or wished you could go to sleep and not wake up? NONE REPORTED 3.  Have you actually had thoughts of killing yourself?  NONE REPORTED 4.  Have you been thinking about how you might do this?  NONE REPORTED 5.  Have you had these thoughts and some intention of acting on them? NONE REPORTED 6.  Have you started to work out or worked out the details to kill yourself? NONE REPORTED 7.  Do you intend to carry out this plan? NONE REPORTED 8. On a scale of 1-5 with 1 being the least severe and 5 being the most severe answer the following questions place for intensity of ideation. ZERO 9. How many times have you had these thoughts? NONE REPORTED 10. When you have the thoughts how long to the last?  NONE REPORTED 11. Control ability.  Could you or can you stop thinking about killing herself or wanting to die if you want to?  YES 12. Are there any things anyone or anything family religion pain of death that stop you from wanting to die or acting on thoughts of committing suicide?  my friends 40.  What sort of reason to do have to think about wanting to die or killing yourself? NONE REPORTED 14.Was it to end the pain or stop the way you are feeling in other words you could not go on living with his pain or how you are feeling or was not to get attention revenge or reaction from others?  Or both?  NONE REPORTED   Plan Of Care/Follow-up recommendations:  Patient is leaving AMA, states he is not really interested in sobriety at this time, patient does not meet IVC criteria. HE was offered inpatient rehab referral with intake planned but is not interested. Unfortunately patient has made poor utilization of sobriety resources throughout admission. Pleasant without any signs of depression or  anxiety. Denies SI or HI.  Shatika Grinnell, MD 06/11/2024, 9:59 AM

## 2024-06-11 NOTE — ED Notes (Signed)
 Patient at phone, calm and composed. No acute distress noted. No concerns voiced. Informed patient to notify staff with any needs or assistance. Patient verbalized understanding or agreement. Safety checks in place per facility policy.

## 2024-06-11 NOTE — ED Notes (Signed)
 Patient states he is ready to discharge. Had approached nurses' station earlier this am asking in regards to this. Writer informed patient we will need provider orders in order to d/c patient, patient voiced understanding and asked when this would be completed. Writer informed patient that I will update him as soon as I head an update from a provider.

## 2024-06-11 NOTE — Group Note (Signed)
 Group Topic: Relapse and Recovery  Group Date: 06/11/2024 Start Time: 1010 End Time: 1015 Facilitators: Kenitra Leventhal, Zane HERO, RN  Department: [facility]  Number of Participants: 1  Group Focus: discharge education Treatment Modality:  Individual Therapy Interventions utilized were patient education Purpose: increase insight  Name: Chase Higgins Date of Birth: July 21, 1971  MR: [id number]    Level of Participation: minimal Quality of Participation: cooperative, and passive Interactions with others: minimal Mood/Affect: flat Triggers (if applicable): None identified Cognition: loose, no insight, and not focused Progress: Moderate Response: Patient appeared disinterested in discharge review. Patient observed calling someone to meet in 30 minutes. Patient explained consequences of leaving AMA, patient appeared unconcerned regarding this information. Plan: patient left Against Medical Advice   Patients Problems:  Patient Active Problem List   Diagnosis Date Noted   Substance induced mood disorder (HCC) 06/09/2024   Cocaine use disorder, severe, dependence (HCC) 06/09/2024   Alcohol use disorder, severe, dependence (HCC) 06/09/2024   Cannabis use disorder, severe, dependence (HCC) 06/09/2024   Cough 11/10/2018   Smoker 11/10/2018   Lower respiratory infection 11/10/2018   Chest congestion 11/10/2018   Gout attack 04/10/2014   Calcium  pyrophosphate arthropathy 02/01/2014   Diabetes mellitus, type 2 (HCC) 01/31/2014   Essential hypertension, benign 01/31/2014   Hypertriglyceridemia 01/31/2014

## 2024-06-11 NOTE — ED Notes (Signed)
 Patient alert & oriented x4. Denies intent to harm self or others when asked. Denies A/VH. Patient denies any physical complaints when asked. No acute distress noted.Scheduled medications administered with no complications.Patient reports wanting to discharge, states they are fit enough to leave and have asked when provider orders will be placed to allow for discharge. Writer have informed providers of request and informed patient that I will update him as soon as I have further updates. Patient voiced understanding. Support and encouragement provided. Routine safety checks conducted per facility protocol. Encouraged patient to notify staff if any thoughts of harm towards self or others arise. Patient verbalizes understanding and agreement.

## 2024-06-11 NOTE — Discharge Instructions (Addendum)
 Patient is leaving against medical advice and states he is no longer interested in rehab referrals. Does not meet IVC criteria.  Social worker not present over the weekend however patient is asking to leave AMA and does not wish to wait.  Per last social work note:  SW received voice mail from [place] [facility] that patient was approved for residential treatment program. SW will follow up with patient regarding acceptance and return call.   Patient instructed to reach out to [facility]
